# Patient Record
Sex: Male | Born: 1990 | Race: Black or African American | Hispanic: No | Marital: Single | Smoking: Former smoker
Health system: Southern US, Community
[De-identification: ages and names within clinical notes are randomized; demographics above are authoritative.]

---

## 2008-12-12 ENCOUNTER — Emergency Department (HOSPITAL_COMMUNITY): Admission: EM | Admit: 2008-12-12 | Discharge: 2008-12-12 | Payer: Self-pay | Admitting: Emergency Medicine

## 2009-07-21 ENCOUNTER — Emergency Department (HOSPITAL_COMMUNITY): Admission: EM | Admit: 2009-07-21 | Discharge: 2009-07-21 | Payer: Self-pay | Admitting: Emergency Medicine

## 2012-11-17 ENCOUNTER — Emergency Department (HOSPITAL_COMMUNITY): Payer: Medicaid Other

## 2012-11-17 ENCOUNTER — Emergency Department (HOSPITAL_COMMUNITY)
Admission: EM | Admit: 2012-11-17 | Discharge: 2012-11-17 | Disposition: A | Discharge status: Home / Self Care | Payer: Medicaid Other | Attending: Emergency Medicine | Admitting: Emergency Medicine

## 2012-11-17 ENCOUNTER — Encounter (HOSPITAL_COMMUNITY): Payer: Self-pay | Admitting: *Deleted

## 2012-11-17 DIAGNOSIS — M25559 Pain in unspecified hip: Secondary | ICD-10-CM | POA: Insufficient documentation

## 2012-11-17 DIAGNOSIS — M7071 Other bursitis of hip, right hip: Secondary | ICD-10-CM

## 2012-11-17 DIAGNOSIS — M76899 Other specified enthesopathies of unspecified lower limb, excluding foot: Secondary | ICD-10-CM | POA: Insufficient documentation

## 2012-11-17 MED ORDER — IBUPROFEN 800 MG PO TABS: 800.0000 mg | ORAL_TABLET | Freq: Three times a day (TID) | ORAL | Status: DC

## 2012-11-17 NOTE — ED Provider Notes (Signed)
History     CSN: [id number]  Arrival date & time 11/17/12  1055   First MD Initiated Contact with Patient 11/17/12 1112      Chief Complaint  Patient presents with  . Leg Pain    (Consider location/radiation/quality/duration/timing/severity/associated sxs/prior treatment) HPI  Pt is a 22 yo M presenting for right lateral hip pain with radiation to leg began 6 months ago after the patient was jogging. He denies tripping, falling, or any other mechanism of injury. He describes the pain as stiffness with some burning sensation. He denies any numbness or tingling to the extremity. He states stretching and light exercise help the pain. Pain is aggravated when he lays on his right side. He denies previous injury to the extremity. Denies fever, chills, vomiting, bladder or bowel incontinence.  History reviewed. No pertinent past medical history.  History reviewed. No pertinent past surgical history.  History reviewed. No pertinent family history.  History  Substance Use Topics  . Smoking status: Former Games developer  . Smokeless tobacco: Not on file  . Alcohol Use: No      Review of Systems  Constitutional: Negative for fever and chills.  HENT: Negative for neck pain.   Eyes: Negative for visual disturbance.  Respiratory: Negative for shortness of breath.   Cardiovascular: Negative for chest pain.  Gastrointestinal: Negative for abdominal pain.  Genitourinary: Negative for dysuria.  Musculoskeletal: Positive for myalgias. Negative for back pain and gait problem.  Neurological: Negative for weakness and numbness.    Allergies  Review of patient's allergies indicates no known allergies.  Home Medications   Current Outpatient Rx  Name  Route  Sig  Dispense  Refill  . ibuprofen (ADVIL,MOTRIN) 800 MG tablet   Oral   Take 1 tablet (800 mg total) by mouth 3 (three) times daily.   30 tablet   0     BP 114/69  Pulse 57  Temp(Src) 97.8 F (36.6 C) (Oral)  Resp 18  SpO2  100%  Physical Exam  Constitutional: He is oriented to person, place, and time. He appears well-developed and well-nourished.  HENT:  Head: Normocephalic and atraumatic.  Eyes: EOM are normal. Pupils are equal, round, and reactive to light.  Cardiovascular: Normal rate, regular rhythm and normal heart sounds.   Pulmonary/Chest: Effort normal and breath sounds normal.  Abdominal: Soft. Bowel sounds are normal.  Musculoskeletal:       Right hip: He exhibits tenderness. He exhibits normal range of motion, normal strength, no swelling and no deformity.       Left hip: Normal.       Legs: Neurological: He is alert and oriented to person, place, and time. Gait normal.  Skin: Skin is warm and dry.  Psychiatric: He has a normal mood and affect.    ED Course  Procedures (including critical care time)  Labs Reviewed - No data to display Dg Hip Complete Right  11/17/2012   *RADIOLOGY REPORT*  Clinical Data: Twisting injury 5 months ago with persistent pain.  RIGHT HIP - COMPLETE 2+ VIEW  Comparison: None  Findings: Femoral heads are located.  Sacroiliac joints are symmetric.  Joint spaces maintained.  No acute fracture.  IMPRESSION: No acute osseous abnormality.   Original Report Authenticated By: Jeronimo Greaves, M.D.     1. Bursitis, hip, right       MDM  Patient with right hip pain with radiation.  No neurological deficits and normal neuro exam. Lateral hip and thigh tender to palpation. Patient can  walk but states is painful.   No loss of bowel or bladder control.  No concern for cauda equina.  No fever, night sweats, weight loss, h/o cancer, IVDU. History and PE findings consistent with trochanteric bursitis. Advised to follow up with PCP and/or orthopedist if not improving. RICE protocol and pain medicine indicated and discussed with patient. Patient is stable at time of discharge          Jeannetta Ellis, PA-C 11/17/12 1610

## 2012-11-17 NOTE — Discharge Instructions (Signed)
Please call up with orthopedist if your hip pain is not improving. Please take all medications as prescribed. Please read all discharge instructions and return precautions.   Hip Pain The hips join the upper legs to the lower pelvis. The bones, cartilage, tendons, and muscles of the hip joint perform a lot of work each day holding your body weight and allowing you to move around. Hip pain is a common symptom. It can range from a minor ache to severe pain on 1 or both hips. Pain may be felt on the inside of the hip joint near the groin, or the outside near the buttocks and upper thigh. There may be swelling or stiffness as well. It occurs more often when a person walks or performs activity. There are many reasons hip pain can develop. CAUSES  It is important to work with your caregiver to identify the cause since many conditions can impact the bones, cartilage, muscles, and tendons of the hips. Causes for hip pain include:  Broken (fractured) bones.  Separation of the thighbone from the hip socket (dislocation).  Torn cartilage of the hip joint.  Swelling (inflammation) of a tendon (tendonitis), the sac within the hip joint (bursitis), or a joint.  A weakening in the abdominal wall (hernia), affecting the nerves to the hip.  Arthritis in the hip joint or lining of the hip joint.  Pinched nerves in the back, hip, or upper thigh.  A bulging disc in the spine (herniated disc).  Rarely, bone infection or cancer. DIAGNOSIS  The location of your hip pain will help your caregiver understand what may be causing the pain. A diagnosis is based on your medical history, your symptoms, results from your physical exam, and results from diagnostic tests. Diagnostic tests may include X-ray exams, a computerized magnetic scan (magnetic resonance imaging, MRI), or bone scan. TREATMENT  Treatment will depend on the cause of your hip pain. Treatment may include:  Limiting activities and resting until  symptoms improve.  Crutches or other walking supports (a cane or brace).  Ice, elevation, and compression.  Physical therapy or home exercises.  Shoe inserts or special shoes.  Losing weight.  Medications to reduce pain.  Undergoing surgery. HOME CARE INSTRUCTIONS   Only take over-the-counter or prescription medicines for pain, discomfort, or fever as directed by your caregiver.  Put ice on the injured area:  Put ice in a plastic bag.  Place a towel between your skin and the bag.  Leave the ice on for 15 to 20 minutes at a time, 3 to 4 times a day.  Keep your leg raised (elevated) when possible to lessen swelling.  Avoid activities that cause pain.  Follow specific exercises as directed by your caregiver.  Sleep with a pillow between your legs on your most comfortable side.  Record how often you have hip pain, the location of the pain, and what it feels like. This information may be helpful to you and your caregiver.  Ask your caregiver about returning to work or sports and whether you should drive.  Follow up with your caregiver for further exams, therapy, or testing as directed. SEEK MEDICAL CARE IF:   Your pain or swelling continues or worsens after 1 week.  You are feeling unwell or have chills.  You have increasing difficulty with walking.  You have a loss of sensation or other new symptoms.  You have questions or concerns. SEEK IMMEDIATE MEDICAL CARE IF:   You cannot put weight on the affected  hip.  You have fallen.  You have a sudden increase in pain and swelling in your hip.  You have a fever. MAKE SURE YOU:   Understand these instructions.  Will watch your condition.  Will get help right away if you are not doing well or get worse. Document Released: 12/07/2009 Document Revised: 09/11/2011 Document Reviewed: 12/07/2009 [facility] Patient Information 2013 [place], [place].    Hip Bursitis Bursitis is a swelling and soreness (inflammation)  of a fluid-filled sac (bursa). This sac overlies and protects the joints.  CAUSES   Injury.  Overuse of the muscles surrounding the joint.  Arthritis.  Gout.  Infection.  Cold weather.  Inadequate warm-up and conditioning prior to activities. The cause may not be known.  SYMPTOMS   Mild to severe irritation.  Tenderness and swelling over the outside of the hip.  Pain with motion of the hip.  If the bursa becomes infected, a fever may be present. Redness, tenderness, and warmth will develop over the hip. Symptoms usually lessen in 3 to 4 weeks with treatment, but can come back. TREATMENT If conservative treatment does not work, your caregiver may advise draining the bursa and injecting cortisone into the area. This may speed up the healing process. This may also be used as an initial treatment of choice. HOME CARE INSTRUCTIONS   Apply ice to the affected area for 15 to 20 minutes every 3 to 4 hours while awake for the first 2 days. Put the ice in a plastic bag and place a towel between the bag of ice and your skin.  Rest the painful joint as much as possible, but continue to put the joint through a normal range of motion at least 4 times per day. When the pain lessens, begin normal, slow movements and usual activities to help prevent stiffness of the hip.  Only take over-the-counter or prescription medicines for pain, discomfort, or fever as directed by your caregiver.  Use crutches to limit weight bearing on the hip joint, if advised.  Elevate your painful hip to reduce swelling. Use pillows for propping and cushioning your legs and hips.  Gentle massage may provide comfort and decrease swelling. SEEK IMMEDIATE MEDICAL CARE IF:   Your pain increases even during treatment, or you are not improving.  You have a fever.  You have heat and inflammation over the involved bursa.  You have any other questions or concerns. MAKE SURE YOU:   Understand these  instructions.  Will watch your condition.  Will get help right away if you are not doing well or get worse. Document Released: 12/09/2001 Document Revised: 09/11/2011 Document Reviewed: 07/08/2008 [facility] Patient Information 2013 [place], [place].

## 2012-11-17 NOTE — ED Notes (Signed)
Patient transported to X-ray 

## 2012-11-17 NOTE — ED Notes (Signed)
Pt reports right hip pain x 6 months, started after jogging. Ambulatory at triage.

## 2012-11-18 NOTE — ED Provider Notes (Signed)
  Medical screening examination/treatment/procedure(s) were performed by non-physician practitioner and as supervising physician I was immediately available for consultation/collaboration.    Gerhard Munch, MD 11/18/12 [phone]

## 2013-11-21 ENCOUNTER — Encounter (HOSPITAL_COMMUNITY): Payer: Self-pay | Admitting: Emergency Medicine

## 2013-11-21 ENCOUNTER — Emergency Department (HOSPITAL_COMMUNITY)
Admission: EM | Admit: 2013-11-21 | Discharge: 2013-11-21 | Discharge status: Against Medical Advice | Payer: Medicaid Other | Attending: Emergency Medicine | Admitting: Emergency Medicine

## 2013-11-21 DIAGNOSIS — M79609 Pain in unspecified limb: Secondary | ICD-10-CM | POA: Insufficient documentation

## 2013-11-21 NOTE — ED Notes (Signed)
Pt. reports chronic right leg pain for 2 years , ambulatory / denies injury.

## 2013-11-21 NOTE — ED Notes (Signed)
PA-C made aware of pts. Leaving without being seen post triage.

## 2013-11-21 NOTE — ED Notes (Signed)
Pt. States, "don't have time to wait...  Just left."

## 2014-01-07 ENCOUNTER — Encounter (HOSPITAL_COMMUNITY): Payer: Self-pay | Admitting: Emergency Medicine

## 2014-01-07 ENCOUNTER — Emergency Department (HOSPITAL_COMMUNITY)
Admission: EM | Admit: 2014-01-07 | Discharge: 2014-01-08 | Disposition: A | Discharge status: Home / Self Care | Payer: Medicaid Other | Attending: Emergency Medicine | Admitting: Emergency Medicine

## 2014-01-07 DIAGNOSIS — Z87891 Personal history of nicotine dependence: Secondary | ICD-10-CM | POA: Diagnosis not present

## 2014-01-07 DIAGNOSIS — L500 Allergic urticaria: Secondary | ICD-10-CM | POA: Insufficient documentation

## 2014-01-07 DIAGNOSIS — Z791 Long term (current) use of non-steroidal anti-inflammatories (NSAID): Secondary | ICD-10-CM | POA: Diagnosis not present

## 2014-01-07 DIAGNOSIS — R21 Rash and other nonspecific skin eruption: Secondary | ICD-10-CM | POA: Diagnosis present

## 2014-01-07 DIAGNOSIS — L509 Urticaria, unspecified: Secondary | ICD-10-CM

## 2014-01-07 NOTE — ED Notes (Signed)
Patient here with rash on legs, back, belly, and arms. Started today while in the heat while wearing long clothing. Patient believes it may be related to his deodorant spray.

## 2014-01-08 MED ORDER — DIPHENHYDRAMINE HCL 50 MG/ML IJ SOLN
25.0000 mg | Freq: Once | INTRAMUSCULAR | Status: AC
  Administered 2014-01-08: 25 mg via INTRAMUSCULAR
  Filled 2014-01-08: qty 1

## 2014-01-08 MED ORDER — DIPHENHYDRAMINE HCL 25 MG PO TABS: 25.0000 mg | ORAL_TABLET | Freq: Four times a day (QID) | ORAL | Status: DC

## 2014-01-08 MED ORDER — PREDNISONE 20 MG PO TABS
60.0000 mg | ORAL_TABLET | Freq: Once | ORAL | Status: AC
  Administered 2014-01-08: 60 mg via ORAL
  Filled 2014-01-08: qty 3

## 2014-01-08 NOTE — ED Provider Notes (Signed)
CSN: [id number]     Arrival date & time 01/07/14  2219 History   First MD Initiated Contact with Patient 01/08/14 0024     Chief Complaint  Patient presents with  . Rash   HPI  History provided by the patient and family. Patient is a 23 year old male with no significant PMH presenting with concern for rash. Patient reports developing a rash shortly after using old spice body deodorant spray around 5 PM. He tried to use some ointment that he has used for his eczema in the past without any improvement. Rash is over bilateral legs, abdomen chest and arms. Denies any swelling of the lips, tongue or mouth. No difficulty breathing or swallowing. No other aggravating or alleviating factors. No other associated symptoms.    History reviewed. No pertinent past medical history. History reviewed. No pertinent past surgical history. History reviewed. No pertinent family history. History  Substance Use Topics  . Smoking status: Former Games developermoker  . Smokeless tobacco: Not on file  . Alcohol Use: No    Review of Systems  Constitutional: Negative for fever.  Respiratory: Negative for shortness of breath.   All other systems reviewed and are negative.     Allergies  Review of patient's allergies indicates no known allergies.  Home Medications   Prior to Admission medications   Medication Sig Start Date End Date Taking? Authorizing Provider  ibuprofen (ADVIL,MOTRIN) 800 MG tablet Take 1 tablet (800 mg total) by mouth 3 (three) times daily. 11/17/12   Jennifer L Piepenbrink, PA-C   BP 126/64  Pulse 65  Temp(Src) 97.8 F (36.6 C) (Oral)  Resp 18  SpO2 99% Physical Exam  Nursing note and vitals reviewed. Constitutional: He is oriented to person, place, and time. He appears well-developed and well-nourished. No distress.  HENT:  Head: Normocephalic.  Cardiovascular: Normal rate and regular rhythm.   Pulmonary/Chest: Effort normal and breath sounds normal.  Abdominal: Soft.  Neurological: He  is alert and oriented to person, place, and time.  Skin: Skin is warm. Rash noted.  Urticarial rash over extremities abdomen chest and arms.  Psychiatric: He has a normal mood and affect. His behavior is normal.    ED Course  Procedures   COORDINATION OF CARE:  Nursing notes reviewed. Vital signs reviewed. Initial pt interview and examination performed.   Filed Vitals:   01/07/14 2243  BP: 126/64  Pulse: 65  Temp: 97.8 F (36.6 C)  TempSrc: Oral  Resp: 18  SpO2: 99%    12:33 AM-patient seen and evaluated. Patient appears well in no acute distress. No signs for severe anaphylactic reaction. Patient does report slight improvements. We'll give dose of steroid and Benadryl. Patient to continue Benadryl at home.       MDM   Final diagnoses:  Hives        Angus Sellereter S Elky Funches, PA-C 01/08/14 0100

## 2014-01-08 NOTE — Discharge Instructions (Signed)
Avoid body sprays. Take benadryl for rash and itch.   Hives Hives are itchy, red, swollen areas of the skin. They can vary in size and location on your body. Hives can come and go for hours or several days (acute hives) or for several weeks (chronic hives). Hives do not spread from person to person (noncontagious). They may get worse with scratching, exercise, and emotional stress. CAUSES   Allergic reaction to food, additives, or drugs.  Infections, including the common cold.  Illness, such as vasculitis, lupus, or thyroid disease.  Exposure to sunlight, heat, or cold.  Exercise.  Stress.  Contact with chemicals. SYMPTOMS   Red or white swollen patches on the skin. The patches may change size, shape, and location quickly and repeatedly.  Itching.  Swelling of the hands, feet, and face. This may occur if hives develop deeper in the skin. DIAGNOSIS  Your caregiver can usually tell what is wrong by performing a physical exam. Skin or blood tests may also be done to determine the cause of your hives. In some cases, the cause cannot be determined. TREATMENT  Mild cases usually get better with medicines such as antihistamines. Severe cases may require an emergency epinephrine injection. If the cause of your hives is known, treatment includes avoiding that trigger.  HOME CARE INSTRUCTIONS   Avoid causes that trigger your hives.  Take antihistamines as directed by your caregiver to reduce the severity of your hives. Non-sedating or low-sedating antihistamines are usually recommended. Do not drive while taking an antihistamine.  Take any other medicines prescribed for itching as directed by your caregiver.  Wear loose-fitting clothing.  Keep all follow-up appointments as directed by your caregiver. SEEK MEDICAL CARE IF:   You have persistent or severe itching that is not relieved with medicine.  You have painful or swollen joints. SEEK IMMEDIATE MEDICAL CARE IF:   You have a  fever.  Your tongue or lips are swollen.  You have trouble breathing or swallowing.  You feel tightness in the throat or chest.  You have abdominal pain. These problems may be the first sign of a life-threatening allergic reaction. Call your local emergency services (911 in [place] MAKE SURE YOU:   Understand these instructions.  Will watch your condition.  Will get help right away if you are not doing well or get worse. Document Released: 06/19/2005 Document Revised: 06/24/2013 Document Reviewed: 09/12/2011 [facility] Patient Information 2015 [employer] [place]. This information is not intended to replace advice given to you by your health care provider. Make sure you discuss any questions you have with your health care provider.

## 2014-01-08 NOTE — ED Provider Notes (Signed)
Medical screening examination/treatment/procedure(s) were performed by non-physician practitioner and as supervising physician I was immediately available for consultation/collaboration.   EKG Interpretation None        Niana Martorana S Kahne Helfand, MD 01/08/14 2015 

## 2014-06-01 ENCOUNTER — Emergency Department (HOSPITAL_COMMUNITY)
Admission: EM | Admit: 2014-06-01 | Discharge: 2014-06-01 | Discharge status: Against Medical Advice | Payer: Medicaid Other | Attending: Emergency Medicine | Admitting: Emergency Medicine

## 2014-06-01 ENCOUNTER — Encounter (HOSPITAL_COMMUNITY): Payer: Self-pay | Admitting: *Deleted

## 2014-06-01 DIAGNOSIS — R079 Chest pain, unspecified: Secondary | ICD-10-CM | POA: Diagnosis present
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
## 2014-06-02 ENCOUNTER — Emergency Department (HOSPITAL_COMMUNITY)
Admission: EM | Admit: 2014-06-02 | Discharge: 2014-06-02 | Disposition: A | Discharge status: Home / Self Care | Payer: Medicaid Other | Attending: Emergency Medicine | Admitting: Emergency Medicine

## 2014-06-02 ENCOUNTER — Encounter (HOSPITAL_COMMUNITY): Payer: Self-pay | Admitting: Emergency Medicine

## 2014-06-02 ENCOUNTER — Emergency Department (HOSPITAL_COMMUNITY): Payer: Medicaid Other

## 2014-06-02 DIAGNOSIS — R55 Syncope and collapse: Secondary | ICD-10-CM | POA: Diagnosis present

## 2014-06-02 DIAGNOSIS — Z87891 Personal history of nicotine dependence: Secondary | ICD-10-CM | POA: Diagnosis not present

## 2014-06-02 DIAGNOSIS — R569 Unspecified convulsions: Secondary | ICD-10-CM | POA: Diagnosis not present

## 2014-06-02 LAB — I-STAT CHEM 8, ED
BUN: 7 mg/dL (ref 6–23)
CALCIUM ION: 1.17 mmol/L (ref 1.12–1.23)
Chloride: 101 mEq/L (ref 96–112)
Creatinine, Ser: 1.1 mg/dL (ref 0.50–1.35)
GLUCOSE: 97 mg/dL (ref 70–99)
HCT: 41 % (ref 39.0–52.0)
Hemoglobin: 13.9 g/dL (ref 13.0–17.0)
Potassium: 3.7 mEq/L (ref 3.7–5.3)
Sodium: 142 mEq/L (ref 137–147)
TCO2: 25 mmol/L (ref 0–100)

## 2014-06-02 NOTE — ED Notes (Signed)
Pt reports he has sucidal thoughts, will notify MD Dr. Hyacinth MeekerMiller

## 2014-06-02 NOTE — ED Notes (Signed)
I gave the patient a happy meal and 2 containers of apple juice per Nurse Joni ReiningNicole.

## 2014-06-02 NOTE — Discharge Instructions (Signed)
°Emergency Department Resource Guide °1) Find a Doctor and Pay Out of Pocket °Although you won't have to find out who is covered by your insurance plan, it is a good idea to ask around and get recommendations. You will then need to call the office and see if the doctor you have chosen will accept you as a new patient and what types of options they offer for patients who are self-pay. Some doctors offer discounts or will set up payment plans for their patients who do not have insurance, but you will need to ask so you aren't surprised when you get to your appointment. ° °2) Contact Your Local Health Department °Not all health departments have doctors that can see patients for sick visits, but many do, so it is worth a call to see if yours does. If you don't know where your local health department is, you can check in your phone book. The CDC also has a tool to help you locate your state's health department, and many state websites also have listings of all of their local health departments. ° °3) Find a Walk-in Clinic °If your illness is not likely to be very severe or complicated, you may want to try a walk in clinic. These are popping up all over the country in pharmacies, drugstores, and shopping centers. They're usually staffed by nurse practitioners or physician assistants that have been trained to treat common illnesses and complaints. They're usually fairly quick and inexpensive. However, if you have serious medical issues or chronic medical problems, these are probably not your best option. ° °No Primary Care Doctor: °- Call [facility] at  832-8000 - they can help you locate a primary care doctor that  accepts your insurance, provides certain services, etc. °- Physician Referral Service- 1-800-533-3463 ° °Chronic Pain Problems: °Organization         Address  Phone   Notes  °[facility]  (336) 297-2271 Patients need to be referred by their primary care doctor.  ° °Medication  Assistance: °Organization         Address  Phone   Notes  °[place] [facility] [address] °[place], [place] [address] (336) 641-8030 --Must be a resident of [place] °-- Must have NO insurance coverage whatsoever (no Medicaid/ Medicare, etc.) °-- The pt. MUST have a primary care doctor that directs their care regularly and follows them in the community °  °[facility]  (866) 331-1348   °[employer]  (888) 892-1162   ° °Agencies that provide inexpensive medical care: °Organization         Address  Phone   Notes  °[facility]  (336) 832-8035   °[facility]    (336) 832-7272   °[facility] [address] °[place], [place] [address] (336) 832-4777   °[facility] [address], °[place] (336) 271-4999   °[facility]    (336) 373-0678   °[facility]    (336) 272-1050   °[facility] ° [address], [place] Phone:  (336) 832-4444, Fax:  (336) 832-4440 Hours of Operation:  9 am - 6 pm, M-F.  Also accepts Medicaid/Medicare and self-pay.  °[facility] ° [address], [place] Phone: (336) 832-3150, Fax: (336) 832-3151. Hours of Operation:  8:30 am - 5:30 pm, M-F.  Also accepts Medicaid and self-pay.  °[facility] [place] [address], [place] Phone: (336) 878-6027   °[facility] [address], [place], [place] (336)723-1848, Ext. 123 Mondays & Thursdays: 7-9 AM.  First 15 patients are seen on a first come, first serve basis. °  ° °Medicaid-accepting [place] Providers: ° °Organization         Address  Phone   Notes  °[facility] [address], [place] (336) 641-2100 Also accepts self-pay patients.  °[facility] [address], [place] ° (336) 856-9996   °[facility] [address], [place]  (336) 288-8857   °[facility] [address] [place] [address], [place] (336) 299-7000   °Veita Bland [address], [place]  ° (336) 373-1557 Only accepts [place] Access Medicaid patients after they have their name applied to their card.  ° °Self-Pay (no insurance) in [place]: ° °Organization         Address  Phone   Notes  °Sickle Cell Patients, [facility] [address], [place] (336) 832-1970   °[facility] [address], [place] (336) 832-4400   °[facility] ° [address], [place] (336) 992-4800   °[facility]/Dr. Osei-Bonsu ° [address] [place] [address], [place] or [address], [place] (336) 841-8500 Phone number for both [place] and [place] locations is the same.  °[facility] [address], [place] (336) 299-0000   °[facility] [address] [place] [address], [place] or [address] (336) 852-7530 °(336) 878-2260   °[facility] [address], [place] (336) 350-1642, phone; (336) 294-5005, fax Sees patients 1st and 3rd Saturday of every month.  Must not qualify for public or private insurance (i.e. Medicaid, Medicare, [place] Health Choice, Veterans' Benefits) • Household income should be no more than 200% of the poverty level •The clinic cannot treat you if you are pregnant or think you are pregnant • Sexually transmitted diseases are not treated at the clinic.  ° ° °Dental Care: °Organization         Address  Phone  Notes  °[place] [facility] [address], [place] (336) 641-6152 Accepts children up to age 21 who are enrolled in Medicaid or [place] Health Choice; pregnant women with a Medicaid card; and children who have applied for Medicaid or [place] Health Choice, but were declined, whose parents can pay a reduced fee at time of service.  °[place]  [facility] [place]  [address], [place] (336) 641-7733 Accepts children up to age 21 who are enrolled in Medicaid or [place] Health Choice; pregnant women with a Medicaid card; and children who have applied for Medicaid or [place] Health Choice, but were declined, whose parents can pay a reduced fee at time of service.  °[facility] ° [address], [place] (336) 641-4533 Patients are seen by appointment only. Walk-ins are not accepted. [facility] will see patients 18 years of age and older. °Monday - Tuesday (8am-5pm) °Most Wednesdays (8:30-5pm) °$30 per visit, cash only  °[facility] ° [address], [place] (336) 641-4533 Patients are seen by appointment only. Walk-ins are not accepted. [facility] will see patients 18 years of age and older. °One   Wednesday Evening (Monthly: Volunteer Based).  $30 per visit, cash only  °[facility]  (919) 537-3737 for adults; Children under age 4, call Graduate Pediatric Dentistry at (919) [phone]. Children aged 4-14, please call (919) 537-3737 to request a pediatric application. ° Dental services are provided in all areas of dental care including fillings, crowns and bridges, complete and partial dentures, implants, gum treatment, root canals, and extractions. Preventive care is also provided. Treatment is provided to both adults and children. °Patients are selected via a lottery and there is often a waiting list. °  °[facility] [address], °[place] ° (336) 763-8833 [email or web address] °  °[facility] [address], [place], [place] (336)723-1848, Ext. 123 Second and Fourth Thursday of each month, opens at 6:30 AM; Clinic ends at 9 AM.  Patients are seen on a first-come first-served basis, and a limited number are seen during each clinic.  ° °[facility] ° [address], [place], [place] (336) 723-7904    Eligibility Requirements °You must have lived in [place], [place], or [place] counties for at least the last three months. °  You cannot be eligible for state or federal sponsored healthcare insurance, including Veterans Administration, Medicaid, or Medicare. °  You generally cannot be eligible for healthcare insurance through your employer.  °  How to apply: °Eligibility screenings are held every Tuesday and Wednesday afternoon from 1:00 pm until 4:00 pm. You do not need an appointment for the interview!  °[facility] [address], [place], [place] 336-631-2330   °[facility]  336-342-8273   °[facility]  336-703-3100   °[facility]  336-570-6415   ° °Behavioral Health Resources in the Community: °Intensive Outpatient Programs °Organization         Address  Phone  Notes  °[facility] [address], [place], [place] 336-878-6098   °[facility] [address], [place], [place] 336-832-9800   °[facility] [address], [place], [place] ° 336-882-2125   °[facility] [address],  °[place], [place] 1-800-853-5163 or 336-641-4981   °Substance Abuse Resources °Organization         Address  Phone  Notes  °[facility]  336-882-2125   °[facility]  336-784-9470   °[facility]  336-285-9073   °[facility]  336-845-3988   °Residential & Outpatient Substance Abuse Program  1-800-659-3381   °Psychological Services °Organization         Address  Phone  Notes  °[facility]  336- 832-9600   °[facility]  336- 378-7881   °[facility] [address], [place] 1-800-853-5163 or 336-641-4981   ° °Mobile Crisis Teams °Organization         Address  Phone  Notes  °[facility], Mobile Crisis Care Unit  1-877-626-1772   °Assertive °[facility] ° [address]  [place], [place] 336-834-9664   °Sharon DeEsch [address] °[place] 336-554-5454   ° °Self-Help/Support Groups °Organization         Address  Phone             Notes  °[facility]  - variety of support groups  336- 373-1402 Call for more information  °Narcotics Anonymous (NA), [facility] [address], °[place]   2 meetings at this location  ° °  Residential Treatment Programs °Organization         Address  Phone  Notes  °[facility] [address],    °[place]  1-866-801-8205   °[facility] ° [address], [place], [place] 704-293-8524   °[facility] [address], [place] 336-845-3988 Admissions: 8am-3pm M-F  °[facility] [address]    °[place], [place] 336-841-1104   °[facility] [address], [place], [place] 336-379-7146   °[facility] [address]  °[place], [place] 336-285-9073   °[facility] - Intensive Outpatient [address], [place], [place] 336-852-3033   °[facility] [address]  °[place], [place] 1-877-615-2722 or 336-784-9470   °[facility] [address] [place], [place] 336-227-7417 Accepts Medicaid  °[facility] [address]  °[place] 1-800-659-3381 Substance Abuse/Addiction Treatment  ° °[place] Behavioral Health Resources °Organization         Address  Phone  Notes  °[facility]  (888) 581-9988   °Julie Brannon, PhD [address] [place], [place]   (336) 349-5553 or (336) 951-0000   °[facility]   [address] °[place], [place] (336) 349-4454   °[facility] [address], [place], [place] (336) 342-8316 Insurance/Medicaid/sponsorship through [facility]  °[facility] [address]                                    [place], [place] (336) 342-8316 Therapy/tele-psych/case    °[facility] [address]  ° [place],  (336) 349-2233    °Dr. Arfeen  (336) 349-4544   °[facility] [place]  [employer] [place] [facility] 1) [address], [place] °2) [address], [place] °3)  [address], [place] (336) 349-3220 °(336) 342-7768 ° °(336) 342-8140   °[place] [facility] (336) 342-1394 or (336) 342-3537 (After Hours)    ° ° °
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
## 2014-06-02 NOTE — ED Notes (Signed)
Chem 8 results given to Dr. Miller 
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
## 2014-06-02 NOTE — ED Notes (Signed)
I gave the patient a container of apple juice per Dr. Hyacinth MeekerMiller.

## 2014-07-01 ENCOUNTER — Emergency Department (HOSPITAL_COMMUNITY)
Admission: EM | Admit: 2014-07-01 | Discharge: 2014-07-01 | Disposition: A | Discharge status: Home / Self Care | Payer: Medicaid Other | Attending: Emergency Medicine | Admitting: Emergency Medicine

## 2014-07-01 ENCOUNTER — Encounter (HOSPITAL_COMMUNITY): Payer: Self-pay | Admitting: *Deleted

## 2014-07-01 DIAGNOSIS — Z87891 Personal history of nicotine dependence: Secondary | ICD-10-CM | POA: Insufficient documentation

## 2014-07-01 DIAGNOSIS — Z202 Contact with and (suspected) exposure to infections with a predominantly sexual mode of transmission: Secondary | ICD-10-CM | POA: Insufficient documentation

## 2014-07-01 LAB — RPR

## 2014-07-01 LAB — HIV ANTIBODY (ROUTINE TESTING W REFLEX): HIV 1&2 Ab, 4th Generation: NONREACTIVE

## 2014-07-01 MED ORDER — AZITHROMYCIN 250 MG PO TABS
1000.0000 mg | ORAL_TABLET | Freq: Once | ORAL | Status: AC
Start: 2014-07-01 — End: 2014-07-01
  Administered 2014-07-01: 1000 mg via ORAL
  Filled 2014-07-01: qty 4

## 2014-07-01 MED ORDER — CEFTRIAXONE SODIUM 250 MG IJ SOLR
250.0000 mg | Freq: Once | INTRAMUSCULAR | Status: AC
  Administered 2014-07-01: 250 mg via INTRAMUSCULAR
  Filled 2014-07-01: qty 250

## 2014-07-01 MED ORDER — LIDOCAINE HCL (PF) 1 % IJ SOLN
2.0000 mL | Freq: Once | INTRAMUSCULAR | Status: AC
  Administered 2014-07-01: 2 mL
  Filled 2014-07-01: qty 5

## 2014-07-01 NOTE — ED Notes (Signed)
Pt reports being exposed to gonorrhea and wants to be checked, denies any symptoms.

## 2014-07-01 NOTE — ED Notes (Signed)
Patient states his girlfriend was dx with gonorhea today.  Patient is here to be checked.  Patient admits to having sex with girlfriend.  Patient denies having any discharge, denies any pain when voiding.  Patient states he has not had any other partner for 4 years.

## 2014-07-01 NOTE — ED Provider Notes (Signed)
CSN: [id number]     Arrival date & time 07/01/14  1417 History  This chart was scribed for Fayrene HelperBowie Derico Mitton, PA-C, working with Toy CookeyMegan Docherty, MD by Chestine SporeSoijett Blue, ED Scribe. The patient was seen in room TR09C/TR09C at 3:08 PM.    Chief Complaint  Patient presents with  . Exposure to STD     The history is provided by the patient. No language interpreter was used.    HPI Comments: Allen FootsDerek Stafford is a 23 y.o. male who presents to the Emergency Department complaining of exposure to STD.  He notes that his girlfriend was dx with gonorrhea today and that she had sex with another partner. [facility]. He reports that he has been sexually active with the same partner for 4 years. He last had sex with his girlfriend last night. He denies eye pain, fever, testicular pain, penile discharge, penile pain, dysuria, and any other symptoms . Denies having STD before. Denies being allergic to any medications.    History reviewed. No pertinent past medical history. History reviewed. No pertinent past surgical history. History reviewed. No pertinent family history. History  Substance Use Topics  . Smoking status: Former Games developermoker  . Smokeless tobacco: Not on file  . Alcohol Use: No    Review of Systems  Constitutional: Negative for fever.  Eyes: Negative for pain.  Genitourinary: Negative for dysuria, discharge, penile pain and testicular pain.      Allergies  Review of patient's allergies indicates no known allergies.  Home Medications   Prior to Admission medications   Medication Sig Start Date End Date Taking? Authorizing Provider  diphenhydrAMINE (BENADRYL) 25 MG tablet Take 1 tablet (25 mg total) by mouth every 6 (six) hours. Patient not taking: Reported on 06/02/2014 01/08/14   Phill MutterPeter S Dammen, PA-C  ibuprofen (ADVIL,MOTRIN) 800 MG tablet Take 1 tablet (800 mg total) by mouth 3 (three) times daily. Patient not taking: Reported on 06/02/2014 11/17/12   Victorino DikeJennifer L Piepenbrink, PA-C    BP 119/81 mmHg  Pulse 65  Temp(Src) 98.1 F (36.7 C) (Oral)  Resp 18  SpO2 100%  Physical Exam  Constitutional: He is oriented to person, place, and time. He appears well-developed and well-nourished. No distress.  HENT:  Head: Normocephalic and atraumatic.  Eyes: EOM are normal.  Neck: Neck supple. No tracheal deviation present.  Cardiovascular: Normal rate.   Pulmonary/Chest: Effort normal. No respiratory distress.  Abdominal: Hernia confirmed negative in the right inguinal area and confirmed negative in the left inguinal area.  Genitourinary: Testes normal. Circumcised. No discharge found.  No rash noted.   Musculoskeletal: Normal range of motion.  Neurological: He is alert and oriented to person, place, and time.  Skin: Skin is warm and dry.  Psychiatric: He has a normal mood and affect. His behavior is normal.  Nursing note and vitals reviewed.   ED Course  Procedures (including critical care time) DIAGNOSTIC STUDIES: Oxygen Saturation is 10% on room air, normal by my interpretation.    COORDINATION OF CARE: 3:12 PM-Discussed treatment plan which includes RPR, HIV antibody, GC/Chlamydia Probe with pt at bedside and pt agreed to plan.   Rocephin and zithromax prophylactic treatment given. Safe sex practice discussed.  Labs Review Labs Reviewed  GC/CHLAMYDIA PROBE AMP  RPR  HIV ANTIBODY (ROUTINE TESTING)    Imaging Review No results found.   EKG Interpretation None      MDM   Final diagnoses:  STD exposure    BP 119/81 mmHg  Pulse 65  Temp(Src) 98.1 F (36.7 C) (Oral)  Resp 18  Wt 150 lb (68.04 kg)  SpO2 100%   I personally performed the services described in this documentation, which was scribed in my presence. The recorded information has been reviewed and is accurate.    Fayrene HelperBowie Niccolo Burggraf, PA-C 07/01/14 1606  Toy CookeyMegan Docherty, MD 07/02/14 [phone]

## 2014-07-01 NOTE — Discharge Instructions (Signed)
You have been given treatment for possible STD infection.  If results are positive for infection, you will be notified.  Follow instruction below.  Sexually Transmitted Disease A sexually transmitted disease (STD) is a disease or infection that may be passed (transmitted) from person to person, usually during sexual activity. This may happen by way of saliva, semen, blood, vaginal mucus, or urine. Common STDs include:   Gonorrhea.   Chlamydia.   Syphilis.   HIV and AIDS.   Genital herpes.   Hepatitis B and C.   Trichomonas.   Human papillomavirus (HPV).   Pubic lice.   Scabies.  Mites.  Bacterial vaginosis. WHAT ARE CAUSES OF STDs? An STD may be caused by bacteria, a virus, or parasites. STDs are often transmitted during sexual activity if one person is infected. However, they may also be transmitted through nonsexual means. STDs may be transmitted after:   Sexual intercourse with an infected person.   Sharing sex toys with an infected person.   Sharing needles with an infected person or using unclean piercing or tattoo needles.  Having intimate contact with the genitals, mouth, or rectal areas of an infected person.   Exposure to infected fluids during birth. WHAT ARE THE SIGNS AND SYMPTOMS OF STDs? Different STDs have different symptoms. Some people may not have any symptoms. If symptoms are present, they may include:   Painful or bloody urination.   Pain in the pelvis, abdomen, vagina, anus, throat, or eyes.   A skin rash, itching, or irritation.  Growths, ulcerations, blisters, or sores in the genital and anal areas.  Abnormal vaginal discharge with or without bad odor.   Penile discharge in men.   Fever.   Pain or bleeding during sexual intercourse.   Swollen glands in the groin area.   Yellow skin and eyes (jaundice). This is seen with hepatitis.   Swollen testicles.  Infertility.  Sores and blisters in the mouth. HOW ARE  STDs DIAGNOSED? To make a diagnosis, your health care provider may:   Take a medical history.   Perform a physical exam.   Take a sample of any discharge to examine.  Swab the throat, cervix, opening to the penis, rectum, or vagina for testing.  Test a sample of your first morning urine.   Perform blood tests.   Perform a Pap test, if this applies.   Perform a colposcopy.   Perform a laparoscopy.  HOW ARE STDs TREATED? Treatment depends on the STD. Some STDs may be treated but not cured.   Chlamydia, gonorrhea, trichomonas, and syphilis can be cured with antibiotic medicine.   Genital herpes, hepatitis, and HIV can be treated, but not cured, with prescribed medicines. The medicines lessen symptoms.   Genital warts from HPV can be treated with medicine or by freezing, burning (electrocautery), or surgery. Warts may come back.   HPV cannot be cured with medicine or surgery. However, abnormal areas may be removed from the cervix, vagina, or vulva.   If your diagnosis is confirmed, your recent sexual partners need treatment. This is true even if they are symptom-free or have a negative culture or evaluation. They should not have sex until their health care providers say it is okay. HOW CAN I REDUCE MY RISK OF GETTING AN STD? Take these steps to reduce your risk of getting an STD:  Use latex condoms, dental dams, and water-soluble lubricants during sexual activity. Do not use petroleum jelly or oils.  Avoid having multiple sex partners.  Do  not have sex with someone who has other sex partners.  Do not have sex with anyone you do not know or who is at high risk for an STD.  Avoid risky sex practices that can break your skin.  Do not have sex if you have open sores on your mouth or skin.  Avoid drinking too much alcohol or taking illegal drugs. Alcohol and drugs can affect your judgment and put you in a vulnerable position.  Avoid engaging in oral and anal sex  acts.  Get vaccinated for HPV and hepatitis. If you have not received these vaccines in the past, talk to your health care provider about whether one or both might be right for you.   If you are at risk of being infected with HIV, it is recommended that you take a prescription medicine daily to prevent HIV infection. This is called pre-exposure prophylaxis (PrEP). You are considered at risk if:  You are a man who has sex with other men (MSM).  You are a heterosexual man or woman and are sexually active with more than one partner.  You take drugs by injection.  You are sexually active with a partner who has HIV.  Talk with your health care provider about whether you are at high risk of being infected with HIV. If you choose to begin PrEP, you should first be tested for HIV. You should then be tested every 3 months for as long as you are taking PrEP.  WHAT SHOULD I DO IF I THINK I HAVE AN STD?  See your health care provider.   Tell your sexual partner(s). They should be tested and treated for any STDs.  Do not have sex until your health care provider says it is okay. WHEN SHOULD I GET IMMEDIATE MEDICAL CARE? Contact your health care provider right away if:   You have severe abdominal pain.  You are a man and notice swelling or pain in your testicles.  You are a woman and notice swelling or pain in your vagina. Document Released: 09/09/2002 Document Revised: 06/24/2013 Document Reviewed: 01/07/2013 [facility] Patient Information 2015 [place], [place]. This information is not intended to replace advice given to you by your health care provider. Make sure you discuss any questions you have with your health care provider.

## 2014-07-02 LAB — GC/CHLAMYDIA PROBE AMP
CT PROBE, AMP APTIMA: POSITIVE — AB
GC PROBE AMP APTIMA: NEGATIVE

## 2014-07-03 ENCOUNTER — Telehealth (HOSPITAL_COMMUNITY): Payer: Self-pay | Admitting: *Deleted

## 2014-07-16 ENCOUNTER — Encounter (HOSPITAL_COMMUNITY): Payer: Self-pay

## 2014-07-16 ENCOUNTER — Emergency Department (HOSPITAL_COMMUNITY)
Admission: EM | Admit: 2014-07-16 | Discharge: 2014-07-16 | Disposition: A | Discharge status: Home / Self Care | Payer: Medicaid Other | Attending: Emergency Medicine | Admitting: Emergency Medicine

## 2014-07-16 DIAGNOSIS — Z87891 Personal history of nicotine dependence: Secondary | ICD-10-CM | POA: Diagnosis not present

## 2014-07-16 DIAGNOSIS — Z113 Encounter for screening for infections with a predominantly sexual mode of transmission: Secondary | ICD-10-CM | POA: Diagnosis present

## 2014-07-16 NOTE — ED Provider Notes (Signed)
CSN: [id number]     Arrival date & time 07/16/14  1216 History  This chart was scribed for non-physician practitioner, Fayrene Helper, PA-C, working with Juliet Rude. Rubin Payor, MD by Charline Bills, ED Scribe. This patient was seen in room TR04C/TR04C and the patient's care was started at 1:45 PM.   Chief Complaint  Patient presents with  . Exposure to STD   The history is provided by the patient. No language interpreter was used.   HPI Comments: Allen Stafford is a 24 y.o. male who presents to the Emergency Department complaining of STD exposure. Pt was seen 2 weeks ago and tested positive for Chlamydia. Pt states that his partner was also treated. He denies fever, abdominal pain, penile discharge, rash and any other symptoms at this time. Pt states that he wants to be reevaluated to make sure Chlamydia has resolved.   History reviewed. No pertinent past medical history. History reviewed. No pertinent past surgical history. History reviewed. No pertinent family history. History  Substance Use Topics  . Smoking status: Former Games developer  . Smokeless tobacco: Not on file  . Alcohol Use: No    Review of Systems  Constitutional: Negative for fever.  Gastrointestinal: Negative for abdominal pain.  Genitourinary: Negative for discharge.  Skin: Negative for rash.    Allergies  Review of patient's allergies indicates no known allergies.  Home Medications   Prior to Admission medications   Medication Sig Start Date End Date Taking? Authorizing Provider  diphenhydrAMINE (BENADRYL) 25 MG tablet Take 1 tablet (25 mg total) by mouth every 6 (six) hours. Patient not taking: Reported on 06/02/2014 01/08/14   Phill Mutter Dammen, PA-C  ibuprofen (ADVIL,MOTRIN) 800 MG tablet Take 1 tablet (800 mg total) by mouth 3 (three) times daily. Patient not taking: Reported on 06/02/2014 11/17/12   Lise Auer Piepenbrink, PA-C   Triage Vitals: BP 123/74 mmHg  Pulse 56  Temp(Src) 97.8 F (36.6 C) (Oral)  Resp 14  Ht 5'  9" (1.753 m)  Wt 160 lb 6.4 oz (72.757 kg)  BMI 23.68 kg/m2  SpO2 100% Physical Exam  Constitutional: He is oriented to person, place, and time. He appears well-developed and well-nourished. No distress.  HENT:  Head: Normocephalic and atraumatic.  Eyes: Conjunctivae and EOM are normal.  Neck: Neck supple.  Cardiovascular: Normal rate.   Pulmonary/Chest: Effort normal.  Genitourinary: Circumcised. No discharge found.  Normal penile shaft. No testicular pain. Normal testicle lie. No rash or lesion. Normal cremasteric reflexes.  Musculoskeletal: Normal range of motion.  Neurological: He is alert and oriented to person, place, and time.  Skin: Skin is warm and dry.  Psychiatric: He has a normal mood and affect. His behavior is normal.  Nursing note and vitals reviewed.  ED Course  Procedures (including critical care time) DIAGNOSTIC STUDIES: Oxygen Saturation is 100% on RA, normal by my interpretation.    COORDINATION OF CARE: 1:48 PM-Discussed treatment plan which includes STD screening with pt at bedside and pt agreed to plan.   Labs Review Labs Reviewed - No data to display  Imaging Review No results found.   EKG Interpretation None      MDM   Final diagnoses:  Screen for STD (sexually transmitted disease)    BP 123/74 mmHg  Pulse 56  Temp(Src) 97.8 F (36.6 C) (Oral)  Resp 14  Ht  (1.753 m)  Wt 160 lb 6.4 oz (72.757 kg)  BMI 23.68 kg/m2  SpO2 100%   I personally performed the services  described in this documentation, which was scribed in my presence. The recorded information has been reviewed and is accurate.    Fayrene HelperBowie Tiamarie Furnari, PA-C 07/16/14 1352  Juliet RudeNathan R. Rubin PayorPickering, MD 07/17/14 [phone]

## 2014-07-16 NOTE — Discharge Instructions (Signed)
We have sent testing for gonorrhea and chlamydia.  If positive, we will contact you to let you know.   Sexually Transmitted Disease A sexually transmitted disease (STD) is a disease or infection that may be passed (transmitted) from person to person, usually during sexual activity. This may happen by way of saliva, semen, blood, vaginal mucus, or urine. Common STDs include:   Gonorrhea.   Chlamydia.   Syphilis.   HIV and AIDS.   Genital herpes.   Hepatitis B and C.   Trichomonas.   Human papillomavirus (HPV).   Pubic lice.   Scabies.  Mites.  Bacterial vaginosis. WHAT ARE CAUSES OF STDs? An STD may be caused by bacteria, a virus, or parasites. STDs are often transmitted during sexual activity if one person is infected. However, they may also be transmitted through nonsexual means. STDs may be transmitted after:   Sexual intercourse with an infected person.   Sharing sex toys with an infected person.   Sharing needles with an infected person or using unclean piercing or tattoo needles.  Having intimate contact with the genitals, mouth, or rectal areas of an infected person.   Exposure to infected fluids during birth. WHAT ARE THE SIGNS AND SYMPTOMS OF STDs? Different STDs have different symptoms. Some people may not have any symptoms. If symptoms are present, they may include:   Painful or bloody urination.   Pain in the pelvis, abdomen, vagina, anus, throat, or eyes.   A skin rash, itching, or irritation.  Growths, ulcerations, blisters, or sores in the genital and anal areas.  Abnormal vaginal discharge with or without bad odor.   Penile discharge in men.   Fever.   Pain or bleeding during sexual intercourse.   Swollen glands in the groin area.   Yellow skin and eyes (jaundice). This is seen with hepatitis.   Swollen testicles.  Infertility.  Sores and blisters in the mouth. HOW ARE STDs DIAGNOSED? To make a diagnosis, your  health care provider may:   Take a medical history.   Perform a physical exam.   Take a sample of any discharge to examine.  Swab the throat, cervix, opening to the penis, rectum, or vagina for testing.  Test a sample of your first morning urine.   Perform blood tests.   Perform a Pap test, if this applies.   Perform a colposcopy.   Perform a laparoscopy.  HOW ARE STDs TREATED? Treatment depends on the STD. Some STDs may be treated but not cured.   Chlamydia, gonorrhea, trichomonas, and syphilis can be cured with antibiotic medicine.   Genital herpes, hepatitis, and HIV can be treated, but not cured, with prescribed medicines. The medicines lessen symptoms.   Genital warts from HPV can be treated with medicine or by freezing, burning (electrocautery), or surgery. Warts may come back.   HPV cannot be cured with medicine or surgery. However, abnormal areas may be removed from the cervix, vagina, or vulva.   If your diagnosis is confirmed, your recent sexual partners need treatment. This is true even if they are symptom-free or have a negative culture or evaluation. They should not have sex until their health care providers say it is okay. HOW CAN I REDUCE MY RISK OF GETTING AN STD? Take these steps to reduce your risk of getting an STD:  Use latex condoms, dental dams, and water-soluble lubricants during sexual activity. Do not use petroleum jelly or oils.  Avoid having multiple sex partners.  Do not have sex with  someone who has other sex partners.  Do not have sex with anyone you do not know or who is at high risk for an STD.  Avoid risky sex practices that can break your skin.  Do not have sex if you have open sores on your mouth or skin.  Avoid drinking too much alcohol or taking illegal drugs. Alcohol and drugs can affect your judgment and put you in a vulnerable position.  Avoid engaging in oral and anal sex acts.  Get vaccinated for HPV and  hepatitis. If you have not received these vaccines in the past, talk to your health care provider about whether one or both might be right for you.   If you are at risk of being infected with HIV, it is recommended that you take a prescription medicine daily to prevent HIV infection. This is called pre-exposure prophylaxis (PrEP). You are considered at risk if:  You are a man who has sex with other men (MSM).  You are a heterosexual man or woman and are sexually active with more than one partner.  You take drugs by injection.  You are sexually active with a partner who has HIV.  Talk with your health care provider about whether you are at high risk of being infected with HIV. If you choose to begin PrEP, you should first be tested for HIV. You should then be tested every 3 months for as long as you are taking PrEP.  WHAT SHOULD I DO IF I THINK I HAVE AN STD?  See your health care provider.   Tell your sexual partner(s). They should be tested and treated for any STDs.  Do not have sex until your health care provider says it is okay. WHEN SHOULD I GET IMMEDIATE MEDICAL CARE? Contact your health care provider right away if:   You have severe abdominal pain.  You are a man and notice swelling or pain in your testicles.  You are a woman and notice swelling or pain in your vagina. Document Released: 09/09/2002 Document Revised: 06/24/2013 Document Reviewed: 01/07/2013 [facility] Patient Information 2015 [place], [place]. This information is not intended to replace advice given to you by your health care provider. Make sure you discuss any questions you have with your health care provider.

## 2014-07-16 NOTE — ED Notes (Signed)
Pt wanting to be reevaluated for gonorrhea.  Was seen here last month and told he had it and was treated.  Wants to make sure "it is out my system".  Denies discharge and pain.

## 2014-07-17 LAB — GC/CHLAMYDIA PROBE AMP (~~LOC~~) NOT AT ARMC
CHLAMYDIA, DNA PROBE: NEGATIVE
NEISSERIA GONORRHEA: NEGATIVE

## 2015-12-07 ENCOUNTER — Emergency Department (HOSPITAL_COMMUNITY)
Admission: EM | Admit: 2015-12-07 | Discharge: 2015-12-07 | Disposition: A | Discharge status: Home / Self Care | Payer: Medicaid Other | Attending: Emergency Medicine | Admitting: Emergency Medicine

## 2015-12-07 ENCOUNTER — Encounter (HOSPITAL_COMMUNITY): Payer: Self-pay

## 2015-12-07 DIAGNOSIS — T7840XA Allergy, unspecified, initial encounter: Secondary | ICD-10-CM

## 2015-12-07 DIAGNOSIS — Y998 Other external cause status: Secondary | ICD-10-CM | POA: Diagnosis not present

## 2015-12-07 DIAGNOSIS — T781XXA Other adverse food reactions, not elsewhere classified, initial encounter: Secondary | ICD-10-CM | POA: Insufficient documentation

## 2015-12-07 DIAGNOSIS — Y9289 Other specified places as the place of occurrence of the external cause: Secondary | ICD-10-CM | POA: Insufficient documentation

## 2015-12-07 DIAGNOSIS — R21 Rash and other nonspecific skin eruption: Secondary | ICD-10-CM | POA: Insufficient documentation

## 2015-12-07 DIAGNOSIS — Y9389 Activity, other specified: Secondary | ICD-10-CM | POA: Diagnosis not present

## 2015-12-07 DIAGNOSIS — X58XXXA Exposure to other specified factors, initial encounter: Secondary | ICD-10-CM | POA: Diagnosis not present

## 2015-12-07 DIAGNOSIS — Z87891 Personal history of nicotine dependence: Secondary | ICD-10-CM | POA: Insufficient documentation

## 2015-12-07 MED ORDER — PREDNISONE 20 MG PO TABS
60.0000 mg | ORAL_TABLET | Freq: Once | ORAL | Status: AC
  Administered 2015-12-07: 60 mg via ORAL
  Filled 2015-12-07: qty 3

## 2015-12-07 MED ORDER — EPINEPHRINE 0.3 MG/0.3ML IJ SOAJ: 0.3000 mg | Freq: Once | INTRAMUSCULAR | Status: DC

## 2015-12-07 MED ORDER — PREDNISONE 10 MG PO TABS: 20.0000 mg | ORAL_TABLET | Freq: Every day | ORAL | Status: DC

## 2015-12-07 NOTE — Discharge Instructions (Signed)

## 2015-12-07 NOTE — ED Provider Notes (Signed)
CSN: [id number]     Arrival date & time 12/07/15  1600 History   First MD Initiated Contact with Patient 12/07/15 1959     Chief Complaint  Patient presents with  . Allergic Reaction     (Consider location/radiation/quality/duration/timing/severity/associated sxs/prior Treatment) HPI Comments: Patient presents with a rash. He states this morning he ate a chocolate covered doughnut and broke out in a rash all over. He wanted to verify that his allergy was actually related to the chocolate, so he ate second donut this afternoon in the hives returned. He has no prior history of food allergies. However his father is allergic to chocolate and he feels that it was the chocolate the eighth caused the allergy. He reports a diffuse rash that is itchy. He denies any facial swelling. No tongue swelling. No trouble swallowing. No shortness of breath or wheezing. He took ibuprofen earlier with some improvement in symptoms.  Patient is a [age] y.o. male presenting with allergic reaction.  Allergic Reaction Presenting symptoms: rash     History reviewed. No pertinent past medical history. History reviewed. No pertinent past surgical history. No family history on file. Social History  Substance Use Topics  . Smoking status: Former Games developermoker  . Smokeless tobacco: None  . Alcohol Use: No    Review of Systems  Constitutional: Negative for fever, chills, diaphoresis and fatigue.  HENT: Negative for congestion, facial swelling, rhinorrhea and sneezing.   Eyes: Negative.   Respiratory: Negative for cough, chest tightness and shortness of breath.   Cardiovascular: Negative for chest pain and leg swelling.  Gastrointestinal: Negative for nausea, vomiting, abdominal pain, diarrhea and blood in stool.  Genitourinary: Negative for frequency, hematuria, flank pain and difficulty urinating.  Musculoskeletal: Negative for back pain and arthralgias.  Skin: Positive for rash.  Neurological: Negative for dizziness,  speech difficulty, weakness, numbness and headaches.      Allergies  Chocolate  Home Medications   Prior to Admission medications   Medication Sig Start Date End Date Taking? Authorizing Provider  diphenhydrAMINE (BENADRYL) 25 MG tablet Take 1 tablet (25 mg total) by mouth every 6 (six) hours. Patient not taking: Reported on 06/02/2014 01/08/14   Ivonne AndrewPeter Dammen, PA-C  EPINEPHrine 0.3 mg/0.3 mL IJ SOAJ injection Inject 0.3 mLs (0.3 mg total) into the muscle once. 12/07/15   Rolan BuccoMelanie Augustine Leverette, MD  ibuprofen (ADVIL,MOTRIN) 800 MG tablet Take 1 tablet (800 mg total) by mouth 3 (three) times daily. Patient not taking: Reported on 06/02/2014 11/17/12   Francee PiccoloJennifer Piepenbrink, PA-C  predniSONE (DELTASONE) 10 MG tablet Take 2 tablets (20 mg total) by mouth daily. 12/07/15   Rolan BuccoMelanie Seher Schlagel, MD   BP 106/69 mmHg  Pulse 60  Temp(Src) 98.3 F (36.8 C) (Oral)  Resp 18  SpO2 100% Physical Exam  Constitutional: He is oriented to person, place, and time. He appears well-developed and well-nourished.  HENT:  Head: Normocephalic and atraumatic.  No angioedema  Eyes: Pupils are equal, round, and reactive to light.  Neck: Normal range of motion. Neck supple.  Cardiovascular: Normal rate, regular rhythm and normal heart sounds.   Pulmonary/Chest: Effort normal and breath sounds normal. No respiratory distress. He has no wheezes. He has no rales. He exhibits no tenderness.  Abdominal: Soft. Bowel sounds are normal. There is no tenderness. There is no rebound and no guarding.  Musculoskeletal: Normal range of motion. He exhibits no edema.  Lymphadenopathy:    He has no cervical adenopathy.  Neurological: He is alert and oriented to person, place,  and time.  Skin: Skin is warm and dry. Rash (Diffuse urticaria to trunk and extremities.) noted.  Psychiatric: He has a normal mood and affect.    ED Course  Procedures (including critical care time) Labs Review Labs Reviewed - No data to display  Imaging Review No  results found. I have personally reviewed and evaluated these images and lab results as part of my medical decision-making.   EKG Interpretation None      MDM   Final diagnoses:  Allergic reaction, initial encounter    Patient presents with an allergic reaction. He has no angioedema or shortness of breath. He was started on a prednisone burst. He was also given a prescription for an EpiPen and instructed how to use it should his symptoms worsen. He was advised to use Benadryl as needed for symptomatic relief. Return precautions were given.    Rolan Bucco, MD 12/07/15 2035

## 2015-12-07 NOTE — ED Notes (Addendum)
Patient complains of second episode of hives x 2 days. Took tylenol pm with relief last pm but returned today, no resp. Distress. Hives noted to trunk, no sob

## 2016-08-01 IMAGING — CT CT HEAD W/O CM
2 series · 16 of 30 positions shown, 18 images · non-contrast
Comparison: None.

CLINICAL DATA: Syncope today.  Seizure-like activity.

EXAM:
CT HEAD WITHOUT CONTRAST
TECHNIQUE: Contiguous axial images were obtained from the base of the skull
through the vertex without intravenous contrast.

[Series 201: head w/o, idose (1) · axial · non-contrast · 0.44mm/px · z∈[+138,+243]mm · 8 of 29 slices shown, 10 images]
[im 4/29  brain]
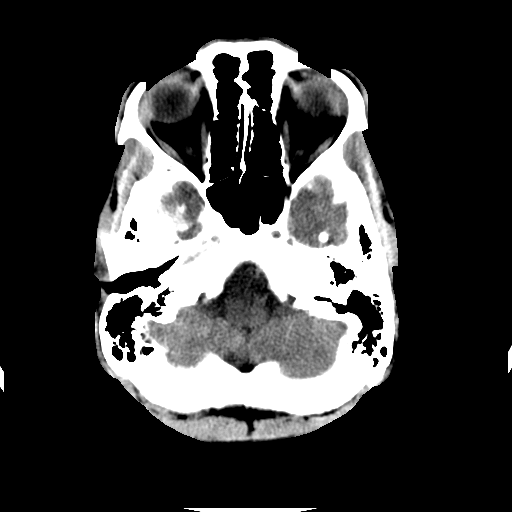
[im 4/29  bone]
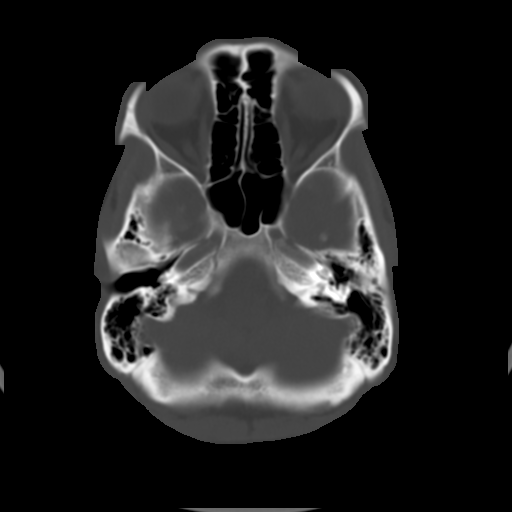
[im 7/29  brain]
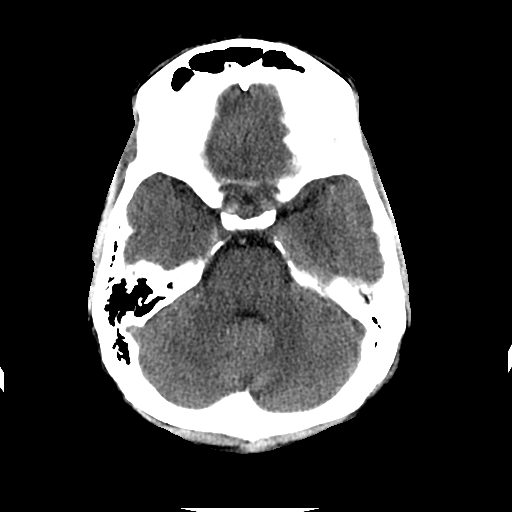
[im 10/29  brain]
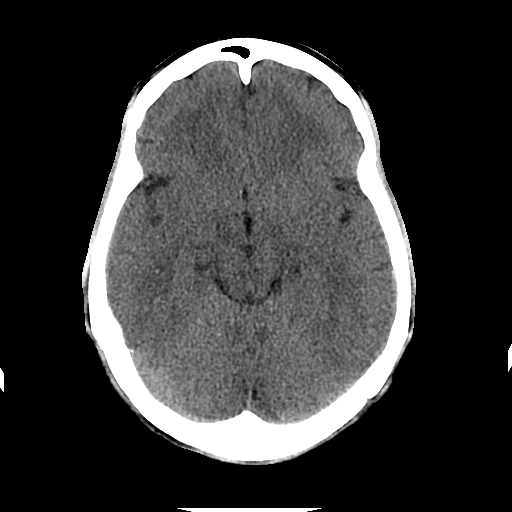
[im 13/29  brain]
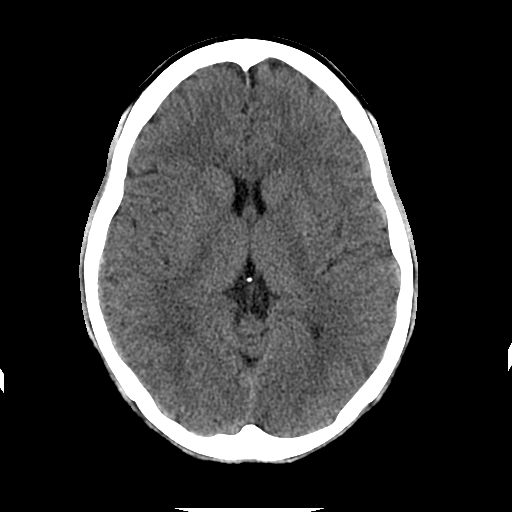
[im 16/29  brain]
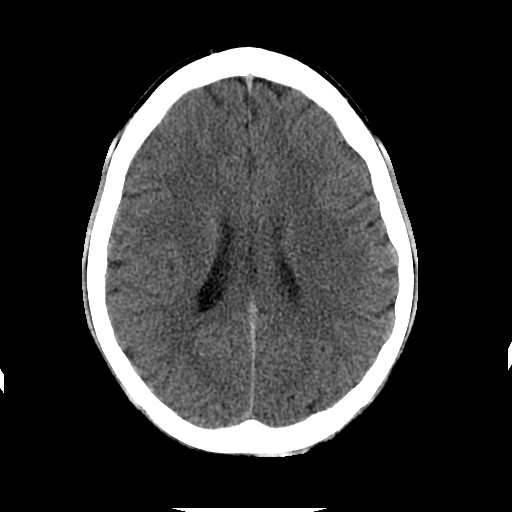
[im 16/29  bone]
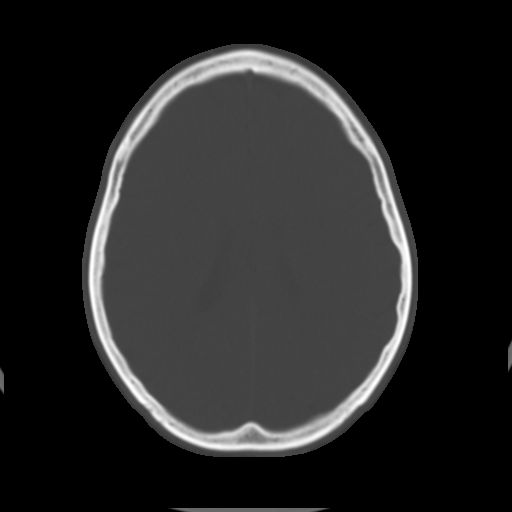
[im 19/29  brain]
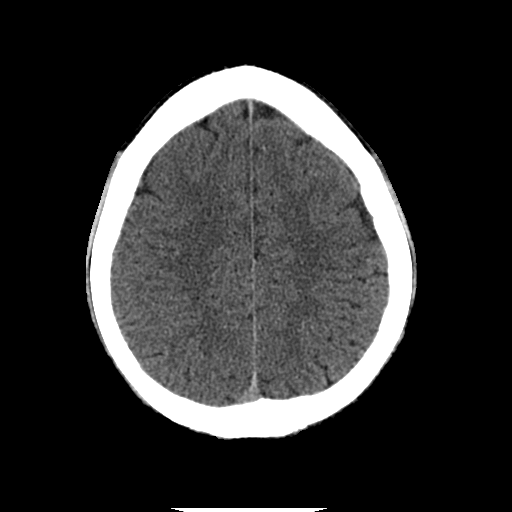
[im 22/29  brain]
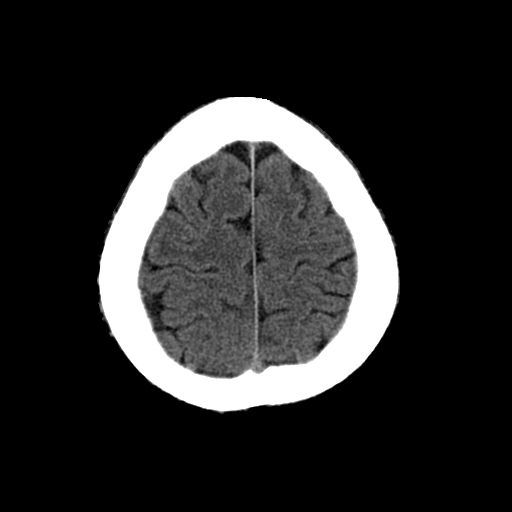
[im 25/29  brain]
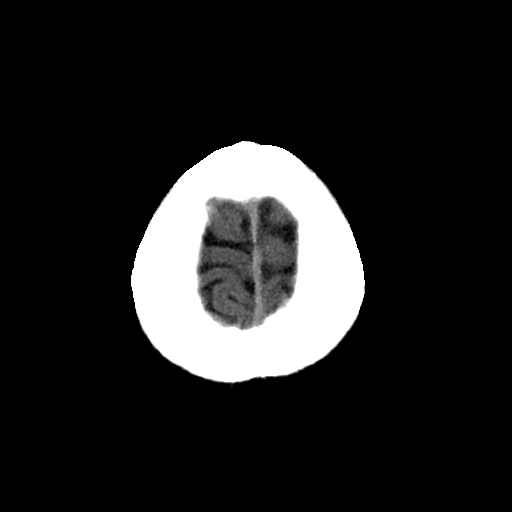

[Series 202: head w/o bone, idose (1) · axial · non-contrast · 0.44mm/px · z∈[+137,+249]mm · 8 of 58 slices shown]
[im 7/58  bone]
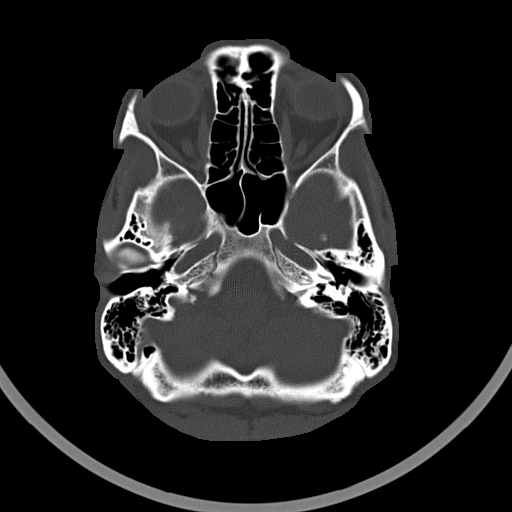
[im 13/58  bone]
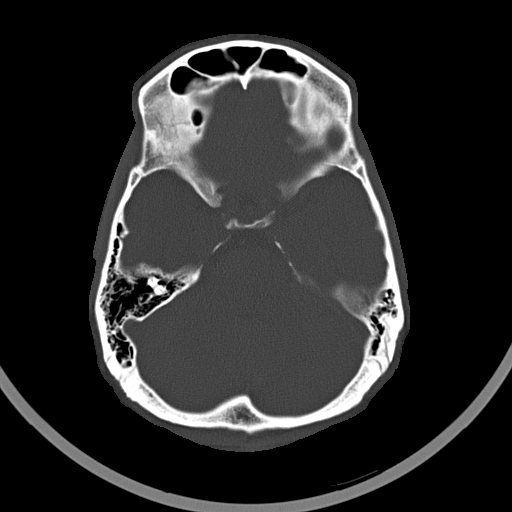
[im 19/58  bone]
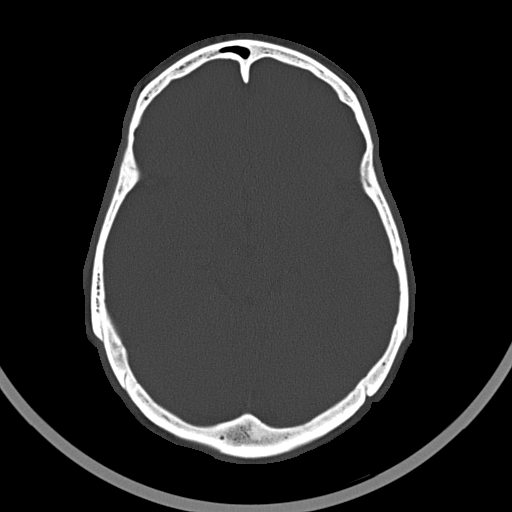
[im 25/58  bone]
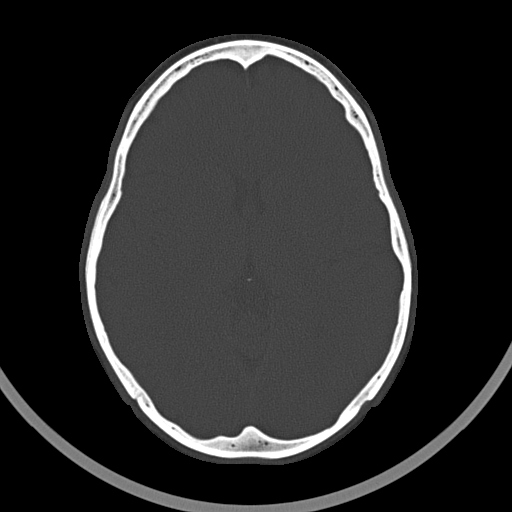
[im 34/58  bone]
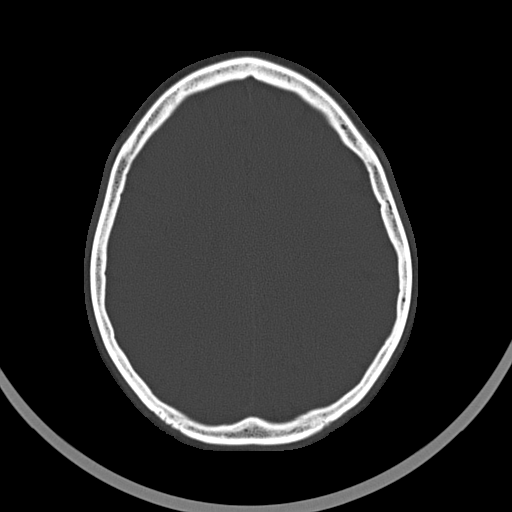
[im 40/58  bone]
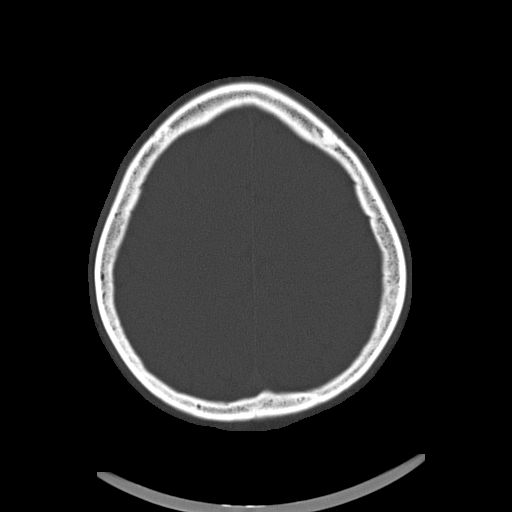
[im 46/58  bone]
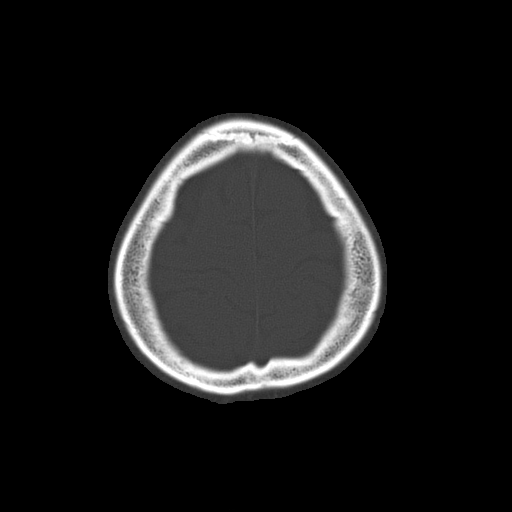
[im 52/58  bone]
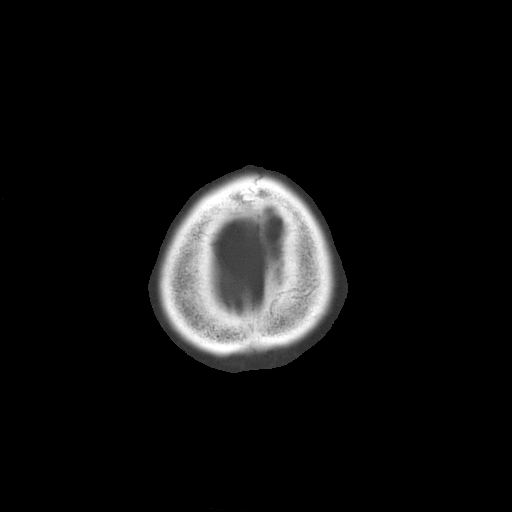

[16 of 30 positions shown; findings below may reference images not displayed]

FINDINGS: No mass lesion. No midline shift. No acute hemorrhage or hematoma.
No extra-axial fluid collections. No evidence of acute infarction.
Brain parenchyma is normal. No osseous abnormality.
IMPRESSION: Normal exam.

## 2016-12-19 ENCOUNTER — Emergency Department (HOSPITAL_COMMUNITY)
Admission: EM | Admit: 2016-12-19 | Discharge: 2016-12-19 | Disposition: A | Discharge status: Home / Self Care | Payer: Medicaid Other | Attending: Emergency Medicine | Admitting: Emergency Medicine

## 2016-12-19 ENCOUNTER — Encounter (HOSPITAL_COMMUNITY): Payer: Self-pay | Admitting: Emergency Medicine

## 2016-12-19 DIAGNOSIS — R0981 Nasal congestion: Secondary | ICD-10-CM | POA: Diagnosis not present

## 2016-12-19 DIAGNOSIS — J329 Chronic sinusitis, unspecified: Secondary | ICD-10-CM

## 2016-12-19 DIAGNOSIS — R51 Headache: Secondary | ICD-10-CM | POA: Diagnosis present

## 2016-12-19 MED ORDER — PSEUDOEPHEDRINE HCL 60 MG PO TABS: 60.0000 mg | ORAL_TABLET | Freq: Four times a day (QID) | ORAL | 0 refills | Status: DC | PRN

## 2016-12-19 MED ORDER — FLUTICASONE PROPIONATE 50 MCG/ACT NA SUSP: 2.0000 | Freq: Every day | NASAL | 2 refills | Status: DC

## 2016-12-19 NOTE — ED Notes (Signed)
Pt assessed and discharged by Tatyana EDPA 

## 2016-12-19 NOTE — ED Triage Notes (Signed)
Pt states he has been having intermittent headaches, nasal congestion, and cough x 3 days

## 2016-12-19 NOTE — Discharge Instructions (Signed)
Continue to take ibuprofen for pain. Take Flonase daily for sinus inflammation. Take Sudafed for congestion. Intranasal saline every 1-2 hours for congestion. Follow-up with family doctor as needed.

## 2016-12-19 NOTE — ED Provider Notes (Signed)
[facility]-EMERGENCY DEPT Provider Note   CSN: [id number] Arrival date & time: 12/19/16  2015     History   Chief Complaint Chief Complaint  Patient presents with  . Headache  . Nasal Congestion    HPI Allen Stafford is a 26 y.o. male.  HPI Allen Stafford is a 26 y.o. male  presents to emergency department complaining of congestion, sinus headache, cough. Patient's symptoms started 3 days ago. He states his symptoms started after his daughter was sick with the same symptoms. He states that he is having frontal headache that radiates to bilateral temple. He is blowing his nose with clear and yellow drainage. He is coughing. Denies shortness of breath. No fever. No other complaints. He has been taking ibuprofen which helps with his headache. He states nothing is making his symptoms worse. No other associated symptoms.  History reviewed. No pertinent past medical history.  There are no active problems to display for this patient.   History reviewed. No pertinent surgical history.     Home Medications    Prior to Admission medications   Medication Sig Start Date End Date Taking? Authorizing Provider  diphenhydrAMINE (BENADRYL) 25 MG tablet Take 1 tablet (25 mg total) by mouth every 6 (six) hours. Patient not taking: Reported on 06/02/2014 01/08/14   Ivonne Andrew, PA-C  EPINEPHrine 0.3 mg/0.3 mL IJ SOAJ injection Inject 0.3 mLs (0.3 mg total) into the muscle once. 12/07/15   Rolan Bucco, MD  ibuprofen (ADVIL,MOTRIN) 800 MG tablet Take 1 tablet (800 mg total) by mouth 3 (three) times daily. Patient not taking: Reported on 06/02/2014 11/17/12   Piepenbrink, Victorino Dike, PA-C  predniSONE (DELTASONE) 10 MG tablet Take 2 tablets (20 mg total) by mouth daily. 12/07/15   Rolan Bucco, MD    Family History Family History  Problem Relation Age of Onset  . Diabetes Mother   . CAD Mother     Social History Social History  Substance Use Topics  . Smoking status: Former Games developer  . Smokeless  tobacco: Never Used  . Alcohol use No     Allergies   Chocolate   Review of Systems Review of Systems  Constitutional: Negative for chills and fever.  HENT: Positive for congestion, rhinorrhea, sinus pain and sore throat.   Respiratory: Positive for cough. Negative for chest tightness and shortness of breath.   Cardiovascular: Negative for chest pain, palpitations and leg swelling.  Gastrointestinal: Negative for abdominal distention, abdominal pain, diarrhea, nausea and vomiting.  Genitourinary: Negative for dysuria, frequency, hematuria and urgency.  Musculoskeletal: Negative for arthralgias, myalgias, neck pain and neck stiffness.  Skin: Negative for rash.  Allergic/Immunologic: Negative for immunocompromised state.  Neurological: Positive for headaches. Negative for dizziness, weakness, light-headedness and numbness.  All other systems reviewed and are negative.    Physical Exam Updated Vital Signs BP 131/81 (BP Location: Left Arm)   Pulse (!) 51   Temp 98.5 F (36.9 C) (Oral)   Resp 20   Ht 5\' 10"  (1.778 m)   Wt 84.9 kg (187 lb 3 oz)   SpO2 100%   BMI 26.86 kg/m   Physical Exam  Constitutional: He appears well-developed and well-nourished. No distress.  HENT:  Head: Normocephalic and atraumatic.  Rhinorrhea bilaterally, clear. Frontal and maxillary sinus tenderness bilaterally. Oropharynx normal. No exudate.  Eyes: Conjunctivae are normal.  Neck: Neck supple.  Cardiovascular: Normal rate, regular rhythm and normal heart sounds.   Pulmonary/Chest: Effort normal. No respiratory distress. He has no wheezes. He has no rales.  Abdominal: Soft. Bowel sounds are normal. He exhibits no distension. There is no tenderness. There is no rebound.  Musculoskeletal: He exhibits no edema.  Neurological: He is alert.  Skin: Skin is warm and dry.  Nursing note and vitals reviewed.    ED Treatments / Results  Labs (all labs ordered are listed, but only abnormal results are  displayed) Labs Reviewed - No data to display  EKG  EKG Interpretation None       Radiology No results found.  Procedures Procedures (including critical care time)  Medications Ordered in ED Medications - No data to display   Initial Impression / Assessment and Plan / ED Course  I have reviewed the triage vital signs and the nursing notes.  Pertinent labs & imaging results that were available during my care of the patient were reviewed by me and considered in my medical decision making (see chart for details).   patient with clear to yellow rhinorrhea, sinus pain, symptoms for 3 days. Afebrile. Otherwise nontoxic appearing. Symptoms and exam was consistent with viral sinusitis. Will treat with intranasal saline, Sudafed, Flonase. Ibuprofen for pain. Follow-up with family doctor as needed.  Vitals:   12/19/16 2030  BP: 131/81  Pulse: (!) 51  Resp: 20  Temp: 98.5 F (36.9 C)  TempSrc: Oral  SpO2: 100%  Weight: 84.9 kg (187 lb 3 oz)  Height: 5\' 10"  (1.778 m)     Final Clinical Impressions(s) / ED Diagnoses   Final diagnoses:  Sinusitis, unspecified chronicity, unspecified location    New Prescriptions New Prescriptions   FLUTICASONE (FLONASE) 50 MCG/ACT NASAL SPRAY    Place 2 sprays into both nostrils daily.   PSEUDOEPHEDRINE (SUDAFED) 60 MG TABLET    Take 1 tablet (60 mg total) by mouth every 6 (six) hours as needed for congestion.     Jaynie CrumbleKirichenko, Sheralee Qazi, PA-C 12/19/16 2243    Doug SouJacubowitz, Sam, MD 12/20/16 [phone]

## 2016-12-19 NOTE — ED Notes (Signed)
Pt was in a rush to leave because their ride is here, did not obtain VS prior to d/c

## 2017-06-15 ENCOUNTER — Emergency Department (HOSPITAL_COMMUNITY)
Admission: EM | Admit: 2017-06-15 | Discharge: 2017-06-15 | Disposition: A | Discharge status: Home / Self Care | Payer: Medicaid Other | Attending: Emergency Medicine | Admitting: Emergency Medicine

## 2017-06-15 ENCOUNTER — Other Ambulatory Visit: Payer: Self-pay

## 2017-06-15 ENCOUNTER — Encounter (HOSPITAL_COMMUNITY): Payer: Self-pay

## 2017-06-15 DIAGNOSIS — M549 Dorsalgia, unspecified: Secondary | ICD-10-CM | POA: Diagnosis present

## 2017-06-15 DIAGNOSIS — Z79899 Other long term (current) drug therapy: Secondary | ICD-10-CM | POA: Insufficient documentation

## 2017-06-15 DIAGNOSIS — Z87891 Personal history of nicotine dependence: Secondary | ICD-10-CM | POA: Insufficient documentation

## 2017-06-15 DIAGNOSIS — M545 Low back pain, unspecified: Secondary | ICD-10-CM

## 2017-06-15 LAB — URINALYSIS, ROUTINE W REFLEX MICROSCOPIC
Bilirubin Urine: NEGATIVE
GLUCOSE, UA: NEGATIVE mg/dL
KETONES UR: NEGATIVE mg/dL
LEUKOCYTES UA: NEGATIVE
Nitrite: NEGATIVE
PROTEIN: NEGATIVE mg/dL
Specific Gravity, Urine: 1.026 (ref 1.005–1.030)
pH: 5 (ref 5.0–8.0)

## 2017-06-15 MED ORDER — METHOCARBAMOL 500 MG PO TABS: 500.0000 mg | ORAL_TABLET | Freq: Two times a day (BID) | ORAL | 0 refills | Status: DC

## 2017-06-15 MED ORDER — NAPROXEN 500 MG PO TABS: 500.0000 mg | ORAL_TABLET | Freq: Two times a day (BID) | ORAL | 0 refills | Status: DC

## 2017-06-15 NOTE — Discharge Instructions (Signed)
Back Pain:  I have prescribed you an anti-inflammatory medication and a muscle relaxer.   Naproxen is a nonsteroidal anti-inflammatory medication that will help with pain and swelling. Be sure to take this medication as prescribed with food, 1 pill every 12 hours,  It should be taken with food, as it can cause stomach upset, and more seriously, stomach bleeding. Do not take other nonsteroidal anti-inflammatory medications with this such as Advil, Motrin, or Aleve.   Robaxin is the muscle relaxer I have prescribed, this is meant to help with muscle tightness. Be aware that this medication may make you drowsy therefore the first time you take this it should be at a time you are in an environment where you can rest. Do not drive or operate heavy machinery when taking this medication.   In addition you may also take Tylenol. Tylenol is generally safe, though you should not take more than 8 of the extra strength (500mg ) pills a day.  Asked the pharmacist at the drugstore about over the counter topical lidoderm patches.   The application of heat can help soothe the pain.  Maintaining your daily activities, including walking, is encourged, as it will help you get better faster than just staying in bed.  Low back pain is discomfort in the lower back that may be due to injuries to muscles and ligaments around the spine.  Occasionally, it may be caused by a a problem to a part of the spine called a disc.  The pain may last several days or a few weeks.   Your pain should get better over the next 2 weeks.  You will need to follow up with  Your primary healthcare provider in 1-2 weeks for reassessment, if you do not have a primary care provider one is provided in your discharge instructions. However if you develop severe or worsening pain, low back pain with fever, numbness, weakness, loss of bowel or bladder control, or inability to walk or urinate, you should return to the ER immediately.  Please follow up with  your doctor this week for a recheck if still having symptoms.

## 2017-06-15 NOTE — ED Provider Notes (Signed)
[facility]   CSN: [id number] Arrival date & time: 06/15/17  1416     History   Chief Complaint Chief Complaint  Patient presents with  . Back Pain    HPI Allen Stafford is a 26 y.o. male without significant past medical history who presents the emergency department complaining of acute on chronic back pain that worsened yesterday.  Patient states that over the past 3 years he has had intermittent problems with pain in his back related to heavy lifting and increased activity, has had periods of time without pain, has not had this evaluated previously.  Describes the pain as being to the right lower back, nonradiating, sharp.  States that is worse with certain movements and certain sitting positions, has improved with Tylenol and with heat application.  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 Denies numbness, tingling, weakness, incontinence to bowel/bladder, fever, chills, IV drug use, or hx of cancer.   HPI  History reviewed. No pertinent past medical history.  There are no active problems to display for this patient.   History reviewed. No pertinent surgical history.     Home Medications    Prior to Admission medications   Medication Sig Start Date End Date Taking? Authorizing Provider  diphenhydrAMINE (BENADRYL) 25 MG tablet Take 1 tablet (25 mg total) by mouth every 6 (six) hours. Patient not taking: Reported on 06/02/2014 01/08/14   Ivonne Andrewammen, Peter, PA-C  EPINEPHrine 0.3 mg/0.3 mL IJ SOAJ injection Inject 0.3 mLs (0.3 mg total) into the muscle once. 12/07/15   Rolan BuccoBelfi, Melanie, MD  fluticasone (FLONASE) 50 MCG/ACT nasal spray Place 2 sprays into both nostrils daily. 12/19/16   Kirichenko, Tatyana, PA-C  ibuprofen (ADVIL,MOTRIN) 800 MG tablet Take 1 tablet (800 mg total) by mouth 3 (three) times daily. Patient not taking: Reported on 06/02/2014 11/17/12    Piepenbrink, Victorino DikeJennifer, PA-C  predniSONE (DELTASONE) 10 MG tablet Take 2 tablets (20 mg total) by mouth daily. 12/07/15   Rolan BuccoBelfi, Melanie, MD  pseudoephedrine (SUDAFED) 60 MG tablet Take 1 tablet (60 mg total) by mouth every 6 (six) hours as needed for congestion. 12/19/16   Jaynie CrumbleKirichenko, Tatyana, PA-C    Family History Family History  Problem Relation Age of Onset  . Diabetes Mother   . CAD Mother     Social History Social History   Tobacco Use  . Smoking status: Former Games developermoker  . Smokeless tobacco: Never Used  Substance Use Topics  . Alcohol use: No  . Drug use: Yes    Types: Marijuana     Allergies   Chocolate   Review of Systems Review of Systems  Constitutional: Negative for chills and fever.  Genitourinary: Negative for dysuria, hematuria, scrotal swelling and testicular pain.  Neurological: Negative for weakness and numbness.       Negative for incontinence     Physical Exam Updated Vital Signs BP (!) 136/99 (BP Location: Right Arm)   Pulse 73   Temp 98.2 F (36.8 C) (Oral)   Resp 16   Ht 5\' 9"  (1.753 m)   Wt 76.7 kg (169 lb)   SpO2 100%   BMI 24.96 kg/m   Physical Exam  Constitutional: He appears well-developed and well-nourished. No distress.  HENT:  Head: Normocephalic and atraumatic.  Eyes: Conjunctivae are normal. Right eye exhibits no discharge. Left eye exhibits no discharge.  Cardiovascular:  Pulses:  Radial pulses are 2+ on the right side, and 2+ on the left side.       Posterior tibial pulses are 2+ on the right side, and 2+ on the left side.  Musculoskeletal:  Back: No midline/vertebral tenderness. Right lumbar paraspinal muscle tenderness to palpation. No CVA tenderness.   Neurological:  Alert. Clear speech. Bilateral upper and lower extremities' sensation intact to sharp and dull touch. 5/5 grip strength bilaterally. 5/5 hip flexion, knee flexion/extension, and ankle plantar and dorsi flexion bilaterally. Patellar DTRs are 2+ and  symmetric. Gait is antalgic, but intact. Patient is able to ambulate on heels and toes.   Psychiatric: He has a normal mood and affect. His behavior is normal. Thought content normal.  Nursing note and vitals reviewed.   ED Treatments / Results  Labs (all labs ordered are listed, but only abnormal results are displayed) Labs Reviewed  URINALYSIS, ROUTINE W REFLEX MICROSCOPIC - Abnormal; Notable for the following components:      Result Value   Hgb urine dipstick SMALL (*)    Bacteria, UA RARE (*)    Squamous Epithelial / LPF 0-5 (*)    All other components within normal limits   EKG  EKG Interpretation None      Radiology No results found.  Procedures Procedures (including critical care time)  Medications Ordered in ED Medications - No data to display  Initial Impression / Assessment and Plan / ED Course  I have reviewed the triage vital signs and the nursing notes.  Pertinent labs & imaging results that were available during my care of the patient were reviewed by me and considered in my medical decision making (see chart for details).    Patient presents with complaint of back pain.  He is nontoxic appearing, in no apparent distress, with stable vital signs. No midline vertebral tenderness, no specific injury, doubt vertebral fracture or dislocation. No neurological deficits and normal neuro exam.  Patient can ambulate but states is painful, also able to ambulate on toes and heels.  No loss of bowel or bladder control.  No concern for cauda equina.  No fever, night sweats, weight loss, h/o cancer, IVDU. UA without signs of cystitis/pyelonephritis. Will treat with Naproxen and Robaxin.  Instructed patient he should not drive or operate heavy machinery while taking Robaxin.  I discussed results, treatment plan, need for PCP follow-up, and return precautions with the patient. Provided opportunity for questions, patient confirmed understanding and is in agreement with plan.    Final Clinical Impressions(s) / ED Diagnoses   Final diagnoses:  Acute right-sided low back pain without sciatica    ED Discharge Orders        Ordered    naproxen (NAPROSYN) 500 MG tablet  2 times daily     06/15/17 1619    methocarbamol (ROBAXIN) 500 MG tablet  2 times daily     06/15/17 1619       Cherly Andersonetrucelli, Ellijah Leffel R, PA-C 06/15/17 1831    Lavera GuiseLiu, Dana Duo, MD 06/18/17 [phone]

## 2017-06-15 NOTE — ED Triage Notes (Signed)
Per Pt, Pt is coming from home with complaints of lower back pain that started three years ago. Pt reports that he works a lot and takes care of his kids.

## 2017-07-16 ENCOUNTER — Ambulatory Visit: Payer: Self-pay | Admitting: Family Medicine

## 2017-09-25 ENCOUNTER — Emergency Department (HOSPITAL_COMMUNITY): Payer: Medicaid Other

## 2017-09-25 ENCOUNTER — Emergency Department (HOSPITAL_COMMUNITY)
Admission: EM | Admit: 2017-09-25 | Discharge: 2017-09-25 | Disposition: A | Discharge status: Home / Self Care | Payer: Medicaid Other | Attending: Emergency Medicine | Admitting: Emergency Medicine

## 2017-09-25 ENCOUNTER — Other Ambulatory Visit: Payer: Self-pay

## 2017-09-25 ENCOUNTER — Encounter (HOSPITAL_COMMUNITY): Payer: Self-pay | Admitting: *Deleted

## 2017-09-25 DIAGNOSIS — Z87891 Personal history of nicotine dependence: Secondary | ICD-10-CM | POA: Insufficient documentation

## 2017-09-25 DIAGNOSIS — R079 Chest pain, unspecified: Secondary | ICD-10-CM | POA: Diagnosis present

## 2017-09-25 DIAGNOSIS — Z79899 Other long term (current) drug therapy: Secondary | ICD-10-CM | POA: Insufficient documentation

## 2017-09-25 DIAGNOSIS — F419 Anxiety disorder, unspecified: Secondary | ICD-10-CM | POA: Diagnosis not present

## 2017-09-25 LAB — BASIC METABOLIC PANEL
ANION GAP: 11 (ref 5–15)
BUN: 13 mg/dL (ref 6–20)
CO2: 24 mmol/L (ref 22–32)
Calcium: 9.3 mg/dL (ref 8.9–10.3)
Chloride: 104 mmol/L (ref 101–111)
Creatinine, Ser: 1.05 mg/dL (ref 0.61–1.24)
Glucose, Bld: 84 mg/dL (ref 65–99)
POTASSIUM: 3.3 mmol/L — AB (ref 3.5–5.1)
Sodium: 139 mmol/L (ref 135–145)

## 2017-09-25 LAB — I-STAT TROPONIN, ED: Troponin i, poc: 0.01 ng/mL (ref 0.00–0.08)

## 2017-09-25 LAB — CBC
HCT: 42.6 % (ref 39.0–52.0)
HEMOGLOBIN: 13.7 g/dL (ref 13.0–17.0)
MCH: 27.5 pg (ref 26.0–34.0)
MCHC: 32.2 g/dL (ref 30.0–36.0)
MCV: 85.4 fL (ref 78.0–100.0)
Platelets: 317 10*3/uL (ref 150–400)
RBC: 4.99 MIL/uL (ref 4.22–5.81)
RDW: 13.7 % (ref 11.5–15.5)
WBC: 4.3 10*3/uL (ref 4.0–10.5)

## 2017-09-25 NOTE — ED Provider Notes (Signed)
[facility] EMERGENCY DEPARTMENT Provider Note   CSN: [id number] Arrival date & time: 09/25/17  1011     History   Chief Complaint Chief Complaint  Patient presents with  . Chest Pain    HPI Allen Stafford is a 27 y.o. male.  HPI   27 year old male presents today with complaints of chest pain and anxiety.  Patient notes that over the last year he has had centralized chest tightness, worse with anxiety.  He notes he was out trying to find a job today which caused significant anxiety, he reports this became so severe that he passed out.  Patient notes he is no longer having significant chest discomfort, denies any shortness of breath.  Denies any history of cardiac disease, reports he smokes marijuana but does not smoke cigarettes.  Patient denies any history of DVT or PE or any significant risk factors.  Patient denies any recent infectious etiology.  History reviewed. No pertinent past medical history.  There are no active problems to display for this patient.   History reviewed. No pertinent surgical history.      Home Medications    Prior to Admission medications   Medication Sig Start Date End Date Taking? Authorizing Provider  EPINEPHrine 0.3 mg/0.3 mL IJ SOAJ injection Inject 0.3 mLs (0.3 mg total) into the muscle once. 12/07/15   Rolan Bucco, MD  fluticasone (FLONASE) 50 MCG/ACT nasal spray Place 2 sprays into both nostrils daily. 12/19/16   Kirichenko, Tatyana, PA-C  methocarbamol (ROBAXIN) 500 MG tablet Take 1 tablet (500 mg total) by mouth 2 (two) times daily. 06/15/17   Petrucelli, Samantha R, PA-C  naproxen (NAPROSYN) 500 MG tablet Take 1 tablet (500 mg total) by mouth 2 (two) times daily. 06/15/17   Petrucelli, Samantha R, PA-C  predniSONE (DELTASONE) 10 MG tablet Take 2 tablets (20 mg total) by mouth daily. 12/07/15   Rolan Bucco, MD  pseudoephedrine (SUDAFED) 60 MG tablet Take 1 tablet (60 mg total) by mouth every 6 (six) hours as needed for  congestion. 12/19/16   Jaynie Crumble, PA-C    Family History Family History  Problem Relation Age of Onset  . Diabetes Mother   . CAD Mother     Social History Social History   Tobacco Use  . Smoking status: Former Games developer  . Smokeless tobacco: Never Used  Substance Use Topics  . Alcohol use: No  . Drug use: Yes    Types: Marijuana     Allergies   Chocolate   Review of Systems Review of Systems  All other systems reviewed and are negative.    Physical Exam Updated Vital Signs BP 111/74 (BP Location: Right Arm)   Pulse 61   Temp 97.9 F (36.6 C) (Oral)   Resp 18   Ht 5\' 9"  (1.753 m)   Wt 77.1 kg (170 lb)   SpO2 100%   BMI 25.10 kg/m   Physical Exam  Constitutional: He is oriented to person, place, and time. He appears well-developed and well-nourished.  HENT:  Head: Normocephalic and atraumatic.  Eyes: Pupils are equal, round, and reactive to light. Conjunctivae are normal. Right eye exhibits no discharge. Left eye exhibits no discharge. No scleral icterus.  Neck: Normal range of motion. No JVD present. No tracheal deviation present.  Cardiovascular: Normal rate, regular rhythm, normal heart sounds and intact distal pulses. Exam reveals no gallop and no friction rub.  No murmur heard. Pulmonary/Chest: Effort normal and breath sounds normal. No stridor. No respiratory distress.  He has no wheezes. He has no rales. He exhibits no tenderness.  Musculoskeletal: He exhibits no edema.  Neurological: He is alert and oriented to person, place, and time. Coordination normal.  Psychiatric: He has a normal mood and affect. His behavior is normal. Judgment and thought content normal.  Nursing note and vitals reviewed.    ED Treatments / Results  Labs (all labs ordered are listed, but only abnormal results are displayed) Labs Reviewed  BASIC METABOLIC PANEL - Abnormal; Notable for the following components:      Result Value   Potassium 3.3 (*)    All other  components within normal limits  CBC  I-STAT TROPONIN, ED    EKG None  Radiology Dg Chest 2 View  Result Date: 09/25/2017 CLINICAL DATA:  Right-sided chest pain, recent cough and congestion EXAM: CHEST - 2 VIEW COMPARISON:  Chest x-ray of 12/12/2008 FINDINGS: No active infiltrate or effusion is seen. Mediastinal and hilar contours are unremarkable. The heart is within normal limits in size. No bony abnormality is seen. IMPRESSION: No active cardiopulmonary disease. Electronically Signed   By: Dwyane DeePaul  Barry M.D.   On: 09/25/2017 10:41    Procedures Procedures (including critical care time)  Medications Ordered in ED Medications - No data to display   Initial Impression / Assessment and Plan / ED Course  I have reviewed the triage vital signs and the nursing notes.  Pertinent labs & imaging results that were available during my care of the patient were reviewed by me and considered in my medical decision making (see chart for details).    Labs: I-STAT troponin, BMP, CBC  Imaging: DG chest 2 view /ED EKG reviewed sinus bradycardia no acute abnormalities  Consults:  Therapeutics:  Discharge Meds:   Assessment/Plan: 27 year old male presents today with likely anxiety.  Patient is well-appearing in no acute distress.  He has reassuring laboratory analysis with normal troponin, reassuring EKG.  I have very low suspicion for ACS, arrhythmia, PE or any other life-threatening etiology.  Stress reduction exercises given, strict return precautions given.  Patient verbalized understanding and agreement to today's plan.   Final Clinical Impressions(s) / ED Diagnoses   Final diagnoses:  Anxiety    ED Discharge Orders    None       Rosalio LoudHedges, Nation Cradle, PA-C 09/25/17 1507    Benjiman CorePickering, Nathan, MD 09/25/17 2210

## 2017-09-25 NOTE — Discharge Instructions (Addendum)
Please read attached information. If you experience any new or worsening signs or symptoms please return to the emergency room for evaluation. Please follow-up with your primary care provider or specialist as discussed.  °

## 2017-09-25 NOTE — ED Triage Notes (Signed)
Pt states this am he was normal and went to look for job and states he has been pushing his body till he was stressing.  Pt states he had chest pain this am and passed out.  Pt states it has never happened before

## 2019-06-25 ENCOUNTER — Other Ambulatory Visit: Payer: Self-pay

## 2019-06-25 ENCOUNTER — Emergency Department (HOSPITAL_COMMUNITY)
Admission: EM | Admit: 2019-06-25 | Discharge: 2019-06-25 | Disposition: A | Discharge status: Home / Self Care | Payer: Medicaid Other | Attending: Emergency Medicine | Admitting: Emergency Medicine

## 2019-06-25 DIAGNOSIS — H6692 Otitis media, unspecified, left ear: Secondary | ICD-10-CM | POA: Insufficient documentation

## 2019-06-25 DIAGNOSIS — Z87891 Personal history of nicotine dependence: Secondary | ICD-10-CM | POA: Insufficient documentation

## 2019-06-25 DIAGNOSIS — H9202 Otalgia, left ear: Secondary | ICD-10-CM | POA: Diagnosis present

## 2019-06-25 MED ORDER — AMOXICILLIN-POT CLAVULANATE 875-125 MG PO TABS: 1.0000 | ORAL_TABLET | Freq: Two times a day (BID) | ORAL | 0 refills | Status: DC

## 2019-06-25 MED ORDER — IBUPROFEN 200 MG PO TABS
600.0000 mg | ORAL_TABLET | Freq: Once | ORAL | Status: AC
  Administered 2019-06-25: 12:00:00 600 mg via ORAL
  Filled 2019-06-25: qty 3

## 2019-06-25 MED ORDER — IBUPROFEN 600 MG PO TABS: 600.0000 mg | ORAL_TABLET | Freq: Four times a day (QID) | ORAL | 0 refills | Status: DC | PRN

## 2019-06-25 NOTE — ED Provider Notes (Signed)
[facility] DEPT Provider Note   CSN: [id number] Arrival date & time: 06/25/19  1047     History Chief Complaint  Patient presents with  . Otalgia    Allen Stafford is a 28 y.o. male.  Allen Stafford is a 28 y.o. male who is otherwise healthy, presents to the ED for evaluation of left ear pain.  Symptoms have been present for 2-3 days.  He reports it is a constant dull ache in the ear.  He denies any associated change in hearing or drainage from the ear.  He denies any associated fevers.  Has had some intermittent nasal congestion, but denies rhinorrhea, sore throat, cough, or headache.  He denies history of frequent ear infections.  He is taken some Tylenol without much improvement, has not tried anything else to treat his symptoms.  No other aggravating or alleviating factors.  The history is provided by the patient.  Otalgia Location:  Left Behind ear:  No abnormality Quality:  Aching and dull Severity:  Moderate Onset quality:  Gradual Duration:  3 days Timing:  Constant Progression:  Worsening Chronicity:  New Context: not direct blow and not foreign body in ear   Relieved by:  Nothing Ineffective treatments:  OTC medications Associated symptoms: congestion   Associated symptoms: no cough, no fever, no headaches, no rhinorrhea, no sore throat and no vomiting        No past medical history on file.  There are no problems to display for this patient.   No past surgical history on file.     Family History  Problem Relation Age of Onset  . Diabetes Mother   . CAD Mother     Social History   Tobacco Use  . Smoking status: Former Research scientist (life sciences)  . Smokeless tobacco: Never Used  Substance Use Topics  . Alcohol use: No  . Drug use: Yes    Types: Marijuana    Home Medications Prior to Admission medications   Medication Sig Start Date End Date Taking? Authorizing Provider  amoxicillin-clavulanate (AUGMENTIN) 875-125 MG tablet Take 1  tablet by mouth 2 (two) times daily. One po bid x 7 days 06/25/19   Allen Larsen, PA-C  EPINEPHrine 0.3 mg/0.3 mL IJ SOAJ injection Inject 0.3 mLs (0.3 mg total) into the muscle once. 12/07/15   Malvin Johns, MD  fluticasone (FLONASE) 50 MCG/ACT nasal spray Place 2 sprays into both nostrils daily. 12/19/16   Stafford, Tatyana, PA-C  ibuprofen (ADVIL) 600 MG tablet Take 1 tablet (600 mg total) by mouth every 6 (six) hours as needed. 06/25/19   Allen Larsen, PA-C  methocarbamol (ROBAXIN) 500 MG tablet Take 1 tablet (500 mg total) by mouth 2 (two) times daily. 06/15/17   Stafford, Allen R, PA-C  naproxen (NAPROSYN) 500 MG tablet Take 1 tablet (500 mg total) by mouth 2 (two) times daily. 06/15/17   Stafford, Allen R, PA-C  predniSONE (DELTASONE) 10 MG tablet Take 2 tablets (20 mg total) by mouth daily. 12/07/15   Malvin Johns, MD  pseudoephedrine (SUDAFED) 60 MG tablet Take 1 tablet (60 mg total) by mouth every 6 (six) hours as needed for congestion. 12/19/16   Stafford, Allen Rocker, PA-C    Allergies    Chocolate  Review of Systems   Review of Systems  Constitutional: Negative for chills and fever.  HENT: Positive for congestion and ear pain. Negative for rhinorrhea and sore throat.   Respiratory: Negative for cough.   Gastrointestinal: Negative for nausea and vomiting.  Neurological: Negative for headaches.  All other systems reviewed and are negative.   Physical Exam Updated Vital Signs BP (!) 137/97 (BP Location: Left Arm)   Pulse (!) 58   Temp 97.9 F (36.6 C) (Oral)   Resp 16   SpO2 100%   Physical Exam Vitals and nursing note reviewed.  Constitutional:      General: He is not in acute distress.    Appearance: He is well-developed. He is not ill-appearing or diaphoretic.  HENT:     Head: Normocephalic and atraumatic.     Right Ear: Tympanic membrane, ear canal and external ear normal.     Left Ear: Ear canal and external ear normal. Tympanic membrane is  erythematous and bulging.     Ears:     Comments: Left TM erythematous and bulging consistent with otitis media    Mouth/Throat:     Mouth: Mucous membranes are moist.  Eyes:     General:        Right eye: No discharge.        Left eye: No discharge.  Neck:     Comments: No rigidity Pulmonary:     Effort: Pulmonary effort is normal. No respiratory distress.  Musculoskeletal:        General: No deformity.     Cervical back: Neck supple.  Lymphadenopathy:     Cervical: No cervical adenopathy.  Skin:    General: Skin is warm and dry.     Capillary Refill: Capillary refill takes less than 2 seconds.  Neurological:     Mental Status: He is alert and oriented to person, place, and time.     Coordination: Coordination normal.  Psychiatric:        Mood and Affect: Mood normal.        Behavior: Behavior normal.     ED Results / Procedures / Treatments   Labs (all labs ordered are listed, but only abnormal results are displayed) Labs Reviewed - No data to display  EKG None  Radiology No results found.  Procedures Procedures (including critical care time)  Medications Ordered in ED Medications  ibuprofen (ADVIL) tablet 600 mg (has no administration in time range)    ED Course  I have reviewed the triage vital signs and the nursing notes.  Pertinent labs & imaging results that were available during my care of the patient were reviewed by me and considered in my medical decision making (see chart for details).    MDM Rules/Calculators/A&P                     Patient presents with left otalgia and exam consistent with acute otitis media. No concern for acute mastoiditis, meningitis.  Will treat with Augmentin.  Encouraged ibuprofen and Tylenol for pain.  Advised for PCP follow-up if symptoms or not improving.  Return precautions discussed.  Patient expresses understanding and agreement with plan.  Discharged home in good condition.  Final Clinical Impression(s) / ED  Diagnoses Final diagnoses:  Left otitis media, unspecified otitis media type    Rx / DC Orders ED Discharge Orders         Ordered    amoxicillin-clavulanate (AUGMENTIN) 875-125 MG tablet  2 times daily     06/25/19 1118    ibuprofen (ADVIL) 600 MG tablet  Every 6 hours PRN     06/25/19 1118           Dartha Lodge, [place] 06/25/19 1124    Madilyn Hook,  Lanora ManisElizabeth, MD 06/26/19 1527

## 2019-06-25 NOTE — ED Triage Notes (Signed)
Pt reports left ear pain.  Patient denies SOB, headache, throat pain, or cough at this time.

## 2019-06-25 NOTE — Discharge Instructions (Addendum)
Take antibiotics twice daily as directed with food.  Use ibuprofen 600 mg every 6 hours, Tylenol 1000 mg every 6 hours to treat your symptoms.  If symptoms are not improving over the next few days please follow-up with your primary care doctor.

## 2019-11-25 IMAGING — CR DG CHEST 2V
2 series · 2 of 2 positions shown · non-contrast
Comparison: Chest x-ray of 12/12/2008

CLINICAL DATA: Right-sided chest pain, recent cough and congestion

EXAM:
CHEST - 2 VIEW

[chest pa]
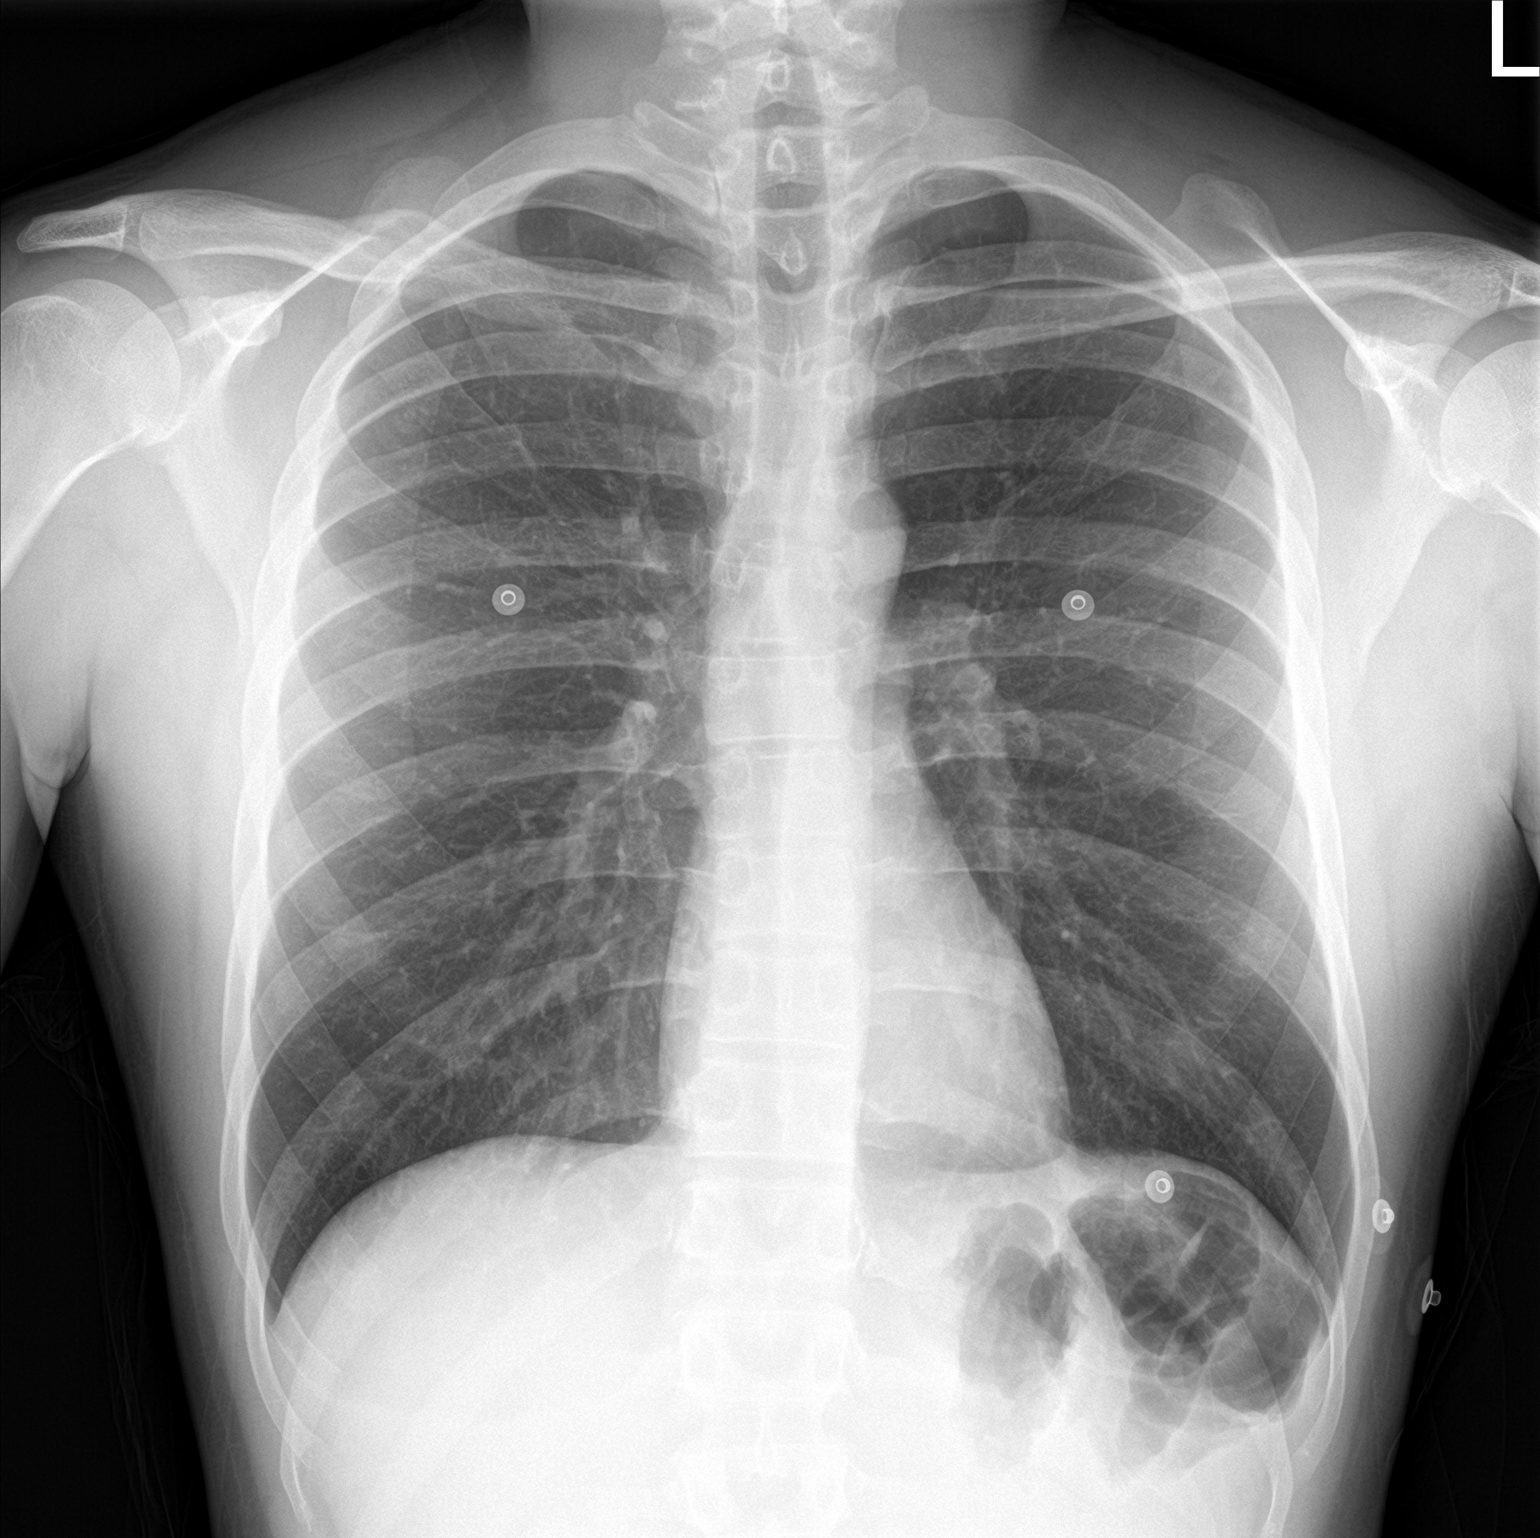

[chest lat]
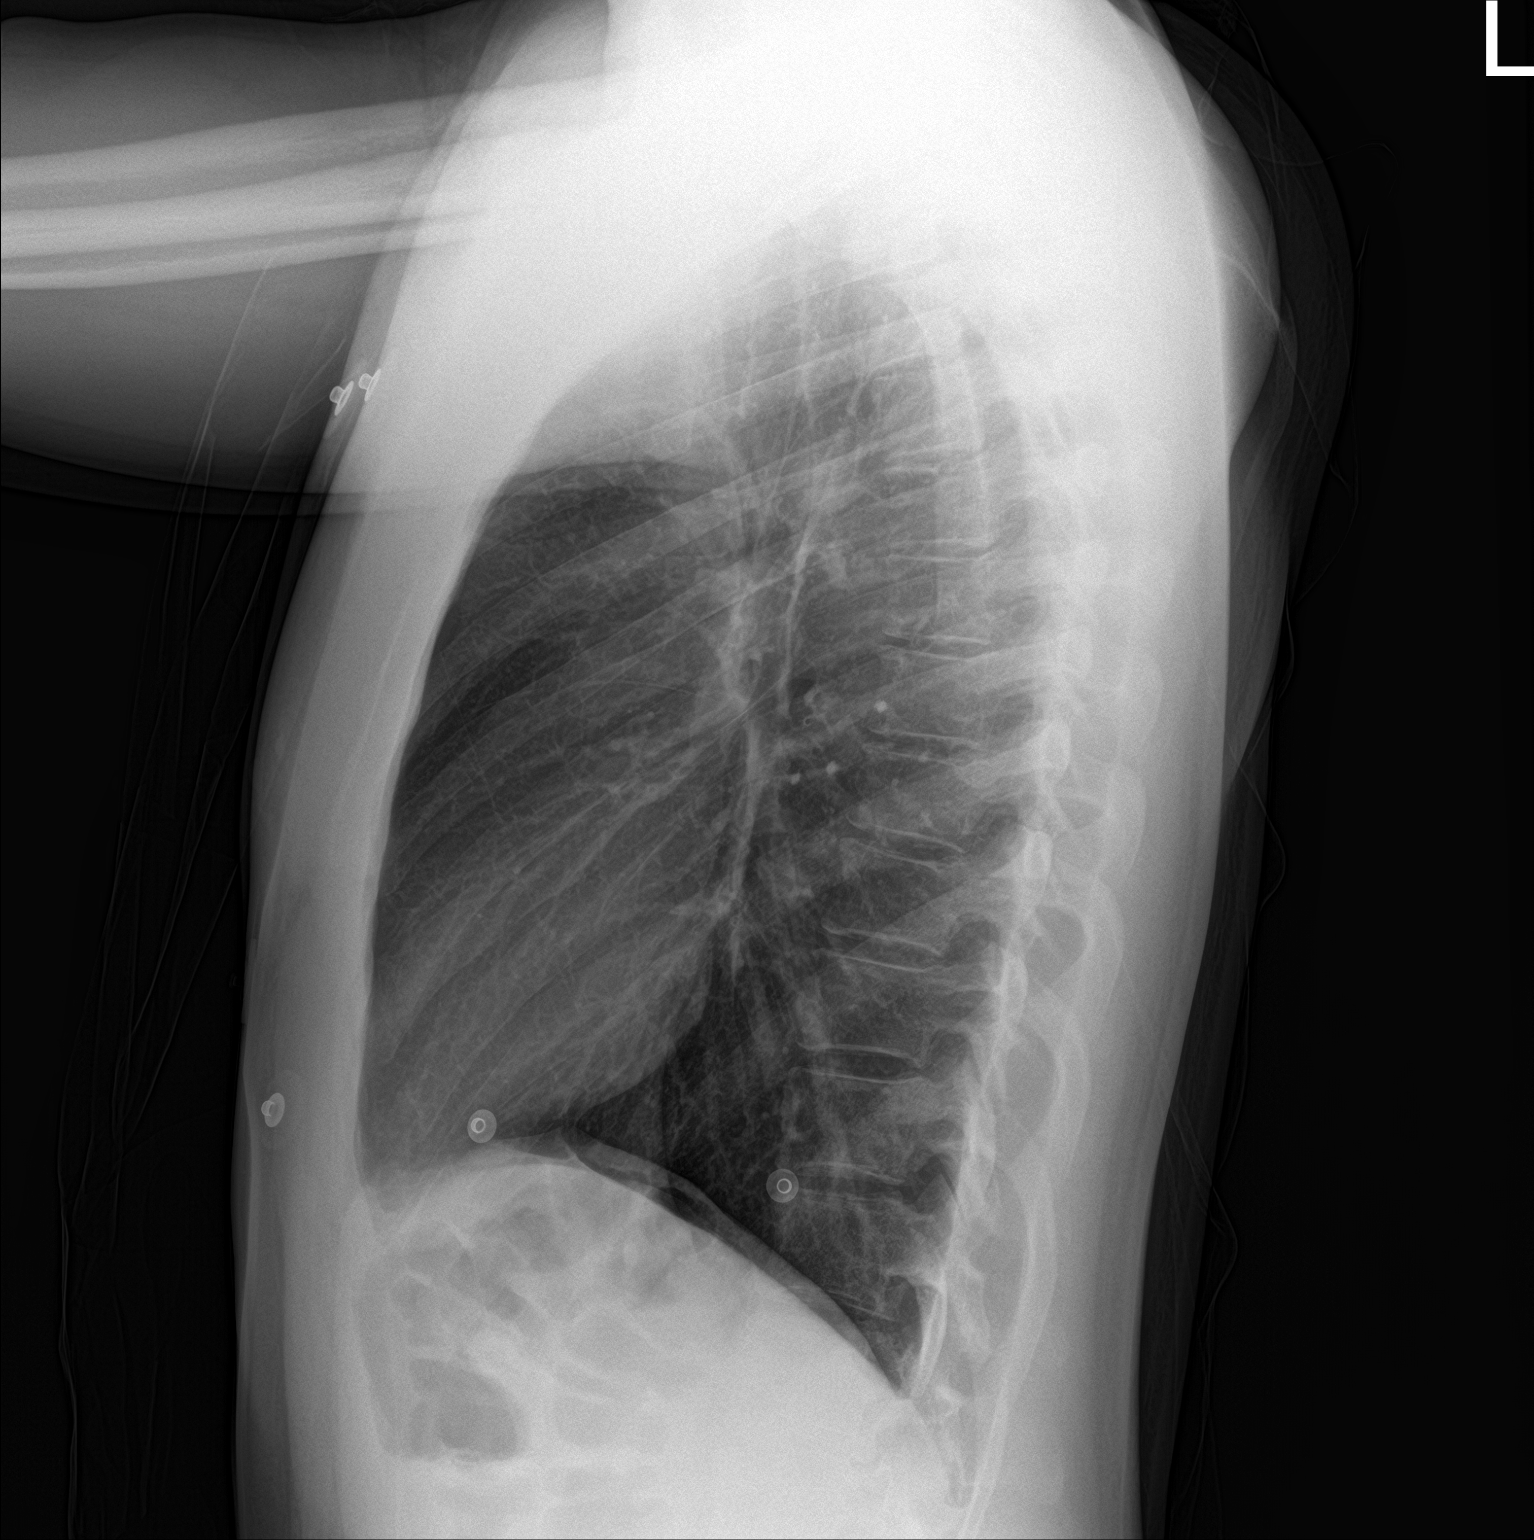

[2 of 2 positions shown; findings below may reference images not displayed]

FINDINGS: No active infiltrate or effusion is seen. Mediastinal and hilar
contours are unremarkable. The heart is within normal limits in
size. No bony abnormality is seen.
IMPRESSION: No active cardiopulmonary disease.

## 2020-10-23 ENCOUNTER — Encounter (HOSPITAL_COMMUNITY): Payer: Self-pay | Admitting: Emergency Medicine

## 2020-10-23 ENCOUNTER — Other Ambulatory Visit: Payer: Self-pay

## 2020-10-23 ENCOUNTER — Emergency Department (HOSPITAL_COMMUNITY)
Admission: EM | Admit: 2020-10-23 | Discharge: 2020-10-23 | Disposition: A | Discharge status: Home / Self Care | Payer: Medicaid Other | Attending: Emergency Medicine | Admitting: Emergency Medicine

## 2020-10-23 DIAGNOSIS — Z91010 Allergy to peanuts: Secondary | ICD-10-CM | POA: Diagnosis not present

## 2020-10-23 DIAGNOSIS — T7840XA Allergy, unspecified, initial encounter: Secondary | ICD-10-CM | POA: Diagnosis not present

## 2020-10-23 DIAGNOSIS — R21 Rash and other nonspecific skin eruption: Secondary | ICD-10-CM | POA: Diagnosis present

## 2020-10-23 DIAGNOSIS — Z87891 Personal history of nicotine dependence: Secondary | ICD-10-CM | POA: Diagnosis not present

## 2020-10-23 MED ORDER — PREDNISONE 20 MG PO TABS
60.0000 mg | ORAL_TABLET | Freq: Once | ORAL | Status: AC
Start: 2020-10-23 — End: 2020-10-23
  Administered 2020-10-23: 60 mg via ORAL
  Filled 2020-10-23: qty 3

## 2020-10-23 MED ORDER — DIPHENHYDRAMINE HCL 25 MG PO CAPS
50.0000 mg | ORAL_CAPSULE | Freq: Once | ORAL | Status: AC
  Administered 2020-10-23: 50 mg via ORAL
  Filled 2020-10-23: qty 2

## 2020-10-23 MED ORDER — EPINEPHRINE 0.3 MG/0.3ML IJ SOAJ: 0.3000 mg | INTRAMUSCULAR | 0 refills | Status: DC | PRN

## 2020-10-23 MED ORDER — PREDNISONE 20 MG PO TABS: 20.0000 mg | ORAL_TABLET | Freq: Two times a day (BID) | ORAL | 0 refills | Status: AC

## 2020-10-23 NOTE — ED Triage Notes (Addendum)
Reports swelling and itchiness to face since eating chicken at his brother's 2 days ago.  Denies SOB.  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

## 2020-10-23 NOTE — Discharge Instructions (Signed)
Call your primary care doctor or specialist as discussed in the next 2-3 days.   Return immediately back to the ER if:  Your symptoms worsen within the next 12-24 hours. You develop new symptoms such as new fevers, persistent vomiting, new pain, shortness of breath, or new weakness or numbness, or if you have any other concerns.  

## 2020-10-23 NOTE — ED Provider Notes (Signed)
[facility] EMERGENCY DEPARTMENT Provider Note   CSN: [id number] Arrival date & time: 10/23/20  1315     History Chief Complaint  Patient presents with  . Allergic Reaction    Allen Stafford is a 30 y.o. male.  Patient states that he is allergic to peanuts and states that 2 days ago he ate some chicken that may or may not have been cooked in peanut oil.  Since that time he has taken Benadryl without significant improvement.  He states that he has had some itchiness to his face and around his eyes he has noticed some puffiness.  Denies any eye pain denies any shortness of breath nausea vomiting or diarrhea.  No history of anaphylaxis.  Taking Benadryl at home with minimal improvement.        History reviewed. No pertinent past medical history.  There are no problems to display for this patient.   History reviewed. No pertinent surgical history.     Family History  Problem Relation Age of Onset  . Diabetes Mother   . CAD Mother     Social History   Tobacco Use  . Smoking status: Former Games developer  . Smokeless tobacco: Never Used  Substance Use Topics  . Alcohol use: No  . Drug use: Yes    Types: Marijuana    Home Medications Prior to Admission medications   Medication Sig Start Date End Date Taking? Authorizing Provider  EPINEPHrine 0.3 mg/0.3 mL IJ SOAJ injection Inject 0.3 mg into the muscle as needed for anaphylaxis. 10/23/20  Yes Cheryll Cockayne, MD  predniSONE (DELTASONE) 20 MG tablet Take 1 tablet (20 mg total) by mouth in the morning and at bedtime for 5 days. 10/23/20 10/28/20 Yes Cheryll Cockayne, MD  amoxicillin-clavulanate (AUGMENTIN) 875-125 MG tablet Take 1 tablet by mouth 2 (two) times daily. One po bid x 7 days 06/25/19   Dartha Lodge, PA-C  fluticasone [facility]) 50 MCG/ACT nasal spray Place 2 sprays into both nostrils daily. 12/19/16   Kirichenko, Tatyana, PA-C  ibuprofen (ADVIL) 600 MG tablet Take 1 tablet (600 mg total) by mouth every 6  (six) hours as needed. 06/25/19   Dartha Lodge, PA-C  methocarbamol (ROBAXIN) 500 MG tablet Take 1 tablet (500 mg total) by mouth 2 (two) times daily. 06/15/17   Petrucelli, Samantha R, PA-C  naproxen (NAPROSYN) 500 MG tablet Take 1 tablet (500 mg total) by mouth 2 (two) times daily. 06/15/17   Petrucelli, Pleas Koch, PA-C  pseudoephedrine (SUDAFED) 60 MG tablet Take 1 tablet (60 mg total) by mouth every 6 (six) hours as needed for congestion. 12/19/16   Kirichenko, Lemont Fillers, PA-C    Allergies    Chocolate  Review of Systems   Review of Systems  Constitutional: Negative for fever.  HENT: Negative for ear pain and sore throat.   Eyes: Negative for pain.  Respiratory: Negative for cough.   Cardiovascular: Negative for chest pain.  Gastrointestinal: Negative for abdominal pain.  Genitourinary: Negative for flank pain.  Musculoskeletal: Negative for back pain.  Skin: Positive for rash. Negative for color change.  Neurological: Negative for syncope.  All other systems reviewed and are negative.   Physical Exam Updated Vital Signs BP 114/80 (BP Location: Right Arm)   Pulse 66   Temp 98.7 F (37.1 C) (Oral)   Resp 17   SpO2 94%   Physical Exam Constitutional:      General: He is not in acute distress.    Appearance: He  is well-developed.  HENT:     Head: Normocephalic.     Nose: Nose normal.  Eyes:     Extraocular Movements: Extraocular movements intact.  Cardiovascular:     Rate and Rhythm: Normal rate.  Pulmonary:     Effort: Pulmonary effort is normal.  Skin:    Coloration: Skin is not jaundiced.     Comments: Currently the rash appears improved no facial swelling or airway impingement noted.  Neurological:     Mental Status: He is alert. Mental status is at baseline.     ED Results / Procedures / Treatments   Labs (all labs ordered are listed, but only abnormal results are displayed) Labs Reviewed - No data to display  EKG None  Radiology No results  found.  Procedures Procedures   Medications Ordered in ED Medications  diphenhydrAMINE (BENADRYL) capsule 50 mg (50 mg Oral Given 10/23/20 1440)  predniSONE (DELTASONE) tablet 60 mg (60 mg Oral Given 10/23/20 1439)    ED Course  I have reviewed the triage vital signs and the nursing notes.  Pertinent labs & imaging results that were available during my care of the patient were reviewed by me and considered in my medical decision making (see chart for details).    MDM Rules/Calculators/A&P                          Patient appears comfortable at this time he appears septic and some medicine prior to my evaluation and symptoms have nearly completely resolved.  He states the Benadryl alone has not been working well at home.  No airway distress noted no swelling of the face or neck rest anywhere else noted.  No visible rashes noted on exam.  Given a prescription of steroids to take at home as well as an EpiPen.  Advised follow-up with his doctor within the week, advised immediate return for worsening symptoms or any additional concerns.  Final Clinical Impression(s) / ED Diagnoses Final diagnoses:  Allergic reaction, initial encounter    Rx / DC Orders ED Discharge Orders         Ordered    predniSONE (DELTASONE) 20 MG tablet  2 times daily        10/23/20 1552    EPINEPHrine 0.3 mg/0.3 mL IJ SOAJ injection  As needed        10/23/20 1552           Cheryll Cockayne, MD 10/23/20 [phone]

## 2020-10-23 NOTE — ED Provider Notes (Signed)
MSE was initiated and I personally evaluated the patient and placed orders (if any) at  1:56 PM on October 23, 2020.  Patient placed in Quick Look pathway, seen and evaluated   Chief Complaint: swelling of face  HPI:   Patient states that he is allergic to peanuts and states that 2 days ago he ate some chicken that may or may not have been cooked in peanut oil.  Since that time he has taken Benadryl without significant improvement.  He states that he has had some itchiness to his face and around his eyes he has noticed some puffiness.  Denies any eye pain denies any shortness of breath nausea vomiting or diarrhea.  No history of anaphylaxis.  ROS: Facial itching and swelling (one)  Physical Exam:   Gen: No distress  Neuro: Awake and Alert  Skin: Warm    Focused Exam: Mild puffiness around both lower eyelids.  Extraocular movements are intact.  Lungs are clear to auscultation all fields.  No abdominal tenderness on exam.  Suspect mild allergic reaction.  Will provide patient with prednisone and Benadryl he will be placed in the waiting room he is not anaphylactic and he will be brought to immediate care when availability presented to self.  Initiation of care has begun. The patient has been counseled on the process, plan, and necessity for staying for the completion/evaluation, and the remainder of the medical screening examination   The patient appears stable so that the remainder of the MSE may be completed by another provider.   Solon Augusta [place], [place] 10/23/20 1357    Tilden Fossa, MD 10/23/20 1445

## 2021-12-02 ENCOUNTER — Emergency Department (HOSPITAL_COMMUNITY)
Admission: EM | Admit: 2021-12-02 | Discharge: 2021-12-02 | Disposition: A | Discharge status: Home / Self Care | Payer: Medicaid Other | Attending: Emergency Medicine | Admitting: Emergency Medicine

## 2021-12-02 ENCOUNTER — Other Ambulatory Visit: Payer: Self-pay

## 2021-12-02 ENCOUNTER — Encounter (HOSPITAL_COMMUNITY): Payer: Self-pay

## 2021-12-02 DIAGNOSIS — S46911A Strain of unspecified muscle, fascia and tendon at shoulder and upper arm level, right arm, initial encounter: Secondary | ICD-10-CM | POA: Diagnosis not present

## 2021-12-02 DIAGNOSIS — X58XXXA Exposure to other specified factors, initial encounter: Secondary | ICD-10-CM | POA: Diagnosis not present

## 2021-12-02 DIAGNOSIS — M25511 Pain in right shoulder: Secondary | ICD-10-CM | POA: Diagnosis present

## 2021-12-02 MED ORDER — METHOCARBAMOL 500 MG PO TABS: 500.0000 mg | ORAL_TABLET | Freq: Two times a day (BID) | ORAL | 0 refills | Status: DC

## 2021-12-02 NOTE — ED Triage Notes (Signed)
Patient complains of right shoulder pain that has been going on for a while but worse over past week. Complains of pain in trap muscle and difficulty "cracking" his neck.  Shoulder is not dislocated.

## 2021-12-02 NOTE — Discharge Instructions (Addendum)
You likely have a muscle strain or muscle spasm in that right shoulder.  I have sent muscle relaxer into the pharmacy for you.  You are given a sling at your request.  Ensure you take your arm out of the sling a few times a day to do range of motion exercises otherwise you can suffer from frozen shoulder.  If you have any worsening symptoms you can return to the emergency room otherwise have attached orthopedic information for you to follow-up with as needed.

## 2021-12-02 NOTE — ED Provider Notes (Signed)
[facility] EMERGENCY DEPARTMENT Provider Note   CSN: [id number] Arrival date & time: 12/02/21  1287     History  Chief Complaint  Patient presents with   Shoulder Pain    Allen Stafford is a 31 y.o. male.  31 year old male presents today for evaluation of right shoulder pain ongoing for about 1.5-week.  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  Denies any known injuries to the right shoulder.  He is without fever, decreased strength in the right arm.  Denies neck pain.  Describes it as muscle tightness/spasm that comes in waves.  Has not tried anything for this over-the-counter.  The history is provided by the patient. No language interpreter was used.      Home Medications Prior to Admission medications   Medication Sig Start Date End Date Taking? Authorizing Provider  methocarbamol (ROBAXIN) 500 MG tablet Take 1 tablet (500 mg total) by mouth 2 (two) times daily. 12/02/21  Yes Karie Mainland, Mina Carlisi, PA-C  amoxicillin-clavulanate (AUGMENTIN) 875-125 MG tablet Take 1 tablet by mouth 2 (two) times daily. One po bid x 7 days 06/25/19   Dartha Lodge, PA-C  EPINEPHrine 0.3 mg/0.3 mL IJ SOAJ injection Inject 0.3 mg into the muscle as needed for anaphylaxis. 10/23/20   Cheryll Cockayne, MD  fluticasone (FLONASE) 50 MCG/ACT nasal spray Place 2 sprays into both nostrils daily. 12/19/16   Kirichenko, Tatyana, PA-C  ibuprofen (ADVIL) 600 MG tablet Take 1 tablet (600 mg total) by mouth every 6 (six) hours as needed. 06/25/19   Dartha Lodge, PA-C  naproxen (NAPROSYN) 500 MG tablet Take 1 tablet (500 mg total) by mouth 2 (two) times daily. 06/15/17   Petrucelli, Pleas Koch, PA-C  pseudoephedrine (SUDAFED) 60 MG tablet Take 1 tablet (60 mg total) by mouth every 6 (six) hours as needed for congestion. 12/19/16   Kirichenko, Lemont Fillers, PA-C      Allergies    Chocolate    Review of Systems   Review of Systems  Constitutional:  Negative for chills and fever.  Musculoskeletal:  Positive for  myalgias. Negative for arthralgias, joint swelling, neck pain and neck stiffness.  Neurological:  Negative for weakness and numbness.  All other systems reviewed and are negative.  Physical Exam Updated Vital Signs BP (!) 136/58 (BP Location: Left Arm)   Pulse 64   Temp 98.3 F (36.8 C) (Oral)   Resp 14   Ht 5\' 9"  (1.753 m)   Wt 77.6 kg   SpO2 100%   BMI 25.25 kg/m  Physical Exam Vitals and nursing note reviewed.  Constitutional:      General: He is not in acute distress.    Appearance: Normal appearance. He is not ill-appearing.  HENT:     Head: Normocephalic and atraumatic.     Nose: Nose normal.  Eyes:     General: No scleral icterus.    Extraocular Movements: Extraocular movements intact.     Conjunctiva/sclera: Conjunctivae normal.  Cardiovascular:     Rate and Rhythm: Normal rate and regular rhythm.  Pulmonary:     Effort: Pulmonary effort is normal. No respiratory distress.  Musculoskeletal:        General: Normal range of motion.     Cervical back: Normal range of motion.     Comments: Good range of motion in the right shoulder joint.  Right shoulder joint without tenderness to palpation.  Right elbow with full range of motion.  5/5 strength in the right upper extremity.  Tightness  of the right trapezius muscle noted.  Cervical spine without tenderness to palpation.  Skin:    General: Skin is warm and dry.  Neurological:     General: No focal deficit present.     Mental Status: He is alert. Mental status is at baseline.    ED Results / Procedures / Treatments   Labs (all labs ordered are listed, but only abnormal results are displayed) Labs Reviewed - No data to display  EKG None  Radiology No results found.  Procedures Procedures    Medications Ordered in ED Medications - No data to display  ED Course/ Medical Decision Making/ A&P                           Medical Decision Making Risk Prescription drug management.   31 year old male  presents today for evaluation of right shoulder pain.  Exam shows tenderness of the trapezius muscle on the right.  Good shoulder range of motion and strength.  Denies any injury to the right shoulder.  Cervical spine without tenderness to palpation.  Patient most likely has muscle strain/spasm.  Will provide Robaxin.  Return precautions discussed.  Patient voices understanding and is in agreement with plan.  He does not have PCP.  Provided him with Ortho follow-up if he does not have improvement in symptoms with Robaxin.   Final Clinical Impression(s) / ED Diagnoses Final diagnoses:  Strain of right shoulder, initial encounter    Rx / DC Orders ED Discharge Orders          Ordered    methocarbamol (ROBAXIN) 500 MG tablet  2 times daily        12/02/21 0734              Marita Kansas, PA-C 12/02/21 8250    Arby Barrette, MD 12/02/21 1146

## 2021-12-12 ENCOUNTER — Encounter (HOSPITAL_COMMUNITY): Payer: Self-pay | Admitting: Emergency Medicine

## 2021-12-12 ENCOUNTER — Emergency Department (HOSPITAL_COMMUNITY)
Admission: EM | Admit: 2021-12-12 | Discharge: 2021-12-12 | Discharge status: Against Medical Advice | Payer: Medicaid Other | Attending: Emergency Medicine | Admitting: Emergency Medicine

## 2021-12-12 ENCOUNTER — Other Ambulatory Visit: Payer: Self-pay

## 2021-12-12 DIAGNOSIS — Z5321 Procedure and treatment not carried out due to patient leaving prior to being seen by health care provider: Secondary | ICD-10-CM | POA: Insufficient documentation

## 2021-12-12 DIAGNOSIS — R519 Headache, unspecified: Secondary | ICD-10-CM | POA: Insufficient documentation

## 2021-12-12 DIAGNOSIS — J3489 Other specified disorders of nose and nasal sinuses: Secondary | ICD-10-CM | POA: Insufficient documentation

## 2021-12-12 DIAGNOSIS — Z20822 Contact with and (suspected) exposure to covid-19: Secondary | ICD-10-CM | POA: Diagnosis not present

## 2021-12-12 DIAGNOSIS — R059 Cough, unspecified: Secondary | ICD-10-CM | POA: Diagnosis not present

## 2021-12-12 LAB — SARS CORONAVIRUS 2 BY RT PCR: SARS Coronavirus 2 by RT PCR: NEGATIVE

## 2021-12-12 NOTE — ED Notes (Signed)
Pt stated "if not seen within 30 minutes pt was leaving". Pt requested to be "signed out".

## 2021-12-12 NOTE — ED Triage Notes (Signed)
C/o runny nose, headache, and productive cough with yellow phlegm since Friday.  Denies fever and chills.

## 2022-06-21 ENCOUNTER — Other Ambulatory Visit: Payer: Self-pay

## 2022-06-21 ENCOUNTER — Encounter (HOSPITAL_COMMUNITY): Payer: Self-pay | Admitting: Emergency Medicine

## 2022-06-21 ENCOUNTER — Emergency Department (HOSPITAL_COMMUNITY)
Admission: EM | Admit: 2022-06-21 | Discharge: 2022-06-21 | Disposition: A | Discharge status: Home / Self Care | Payer: Medicaid Other | Attending: Emergency Medicine | Admitting: Emergency Medicine

## 2022-06-21 DIAGNOSIS — R051 Acute cough: Secondary | ICD-10-CM

## 2022-06-21 DIAGNOSIS — U071 COVID-19: Secondary | ICD-10-CM | POA: Diagnosis not present

## 2022-06-21 DIAGNOSIS — M791 Myalgia, unspecified site: Secondary | ICD-10-CM | POA: Diagnosis present

## 2022-06-21 LAB — RESP PANEL BY RT-PCR (RSV, FLU A&B, COVID)  RVPGX2
Influenza A by PCR: NEGATIVE
Influenza B by PCR: NEGATIVE
Resp Syncytial Virus by PCR: NEGATIVE
SARS Coronavirus 2 by RT PCR: POSITIVE — AB

## 2022-06-21 LAB — GROUP A STREP BY PCR: Group A Strep by PCR: NOT DETECTED

## 2022-06-21 MED ORDER — BENZONATATE 100 MG PO CAPS: 100.0000 mg | ORAL_CAPSULE | Freq: Three times a day (TID) | ORAL | 0 refills | Status: DC

## 2022-06-21 MED ORDER — ONDANSETRON 4 MG PO TBDP: 4.0000 mg | ORAL_TABLET | Freq: Three times a day (TID) | ORAL | 0 refills | Status: DC | PRN

## 2022-06-21 MED ORDER — ACETAMINOPHEN ER 650 MG PO TBCR: 650.0000 mg | EXTENDED_RELEASE_TABLET | Freq: Three times a day (TID) | ORAL | 0 refills | Status: DC | PRN

## 2022-06-21 MED ORDER — BENZONATATE 100 MG PO CAPS
100.0000 mg | ORAL_CAPSULE | Freq: Once | ORAL | Status: AC
  Administered 2022-06-21: 100 mg via ORAL
  Filled 2022-06-21: qty 1

## 2022-06-21 MED ORDER — ACETAMINOPHEN 500 MG PO TABS
1000.0000 mg | ORAL_TABLET | Freq: Once | ORAL | Status: AC
  Administered 2022-06-21: 1000 mg via ORAL
  Filled 2022-06-21: qty 2

## 2022-06-21 NOTE — Discharge Instructions (Signed)
It was a pleasure taking care of you today.  As discussed, your COVID test was positive.  I am sending you home with symptomatic treatment.  Take as needed.  You must quarantine for 5 days since symptom onset.  Follow-up with PCP if symptoms do not improve over the next few days.  Return to the ER for any worsening symptoms.

## 2022-06-21 NOTE — ED Triage Notes (Signed)
Pt reports body aches, lower abd pain, leg pain and back pain, and headaches for 2 days.  Denies n/v/d.

## 2022-06-21 NOTE — ED Provider Notes (Signed)
[facility] EMERGENCY DEPARTMENT Provider Note   CSN: [id number] Arrival date & time: 06/21/22  2119     History  Chief Complaint  Patient presents with   Generalized Body Aches    Allen Stafford is a 31 y.o. male with no significant past medical history who presents to the ED due to myalgias, sore throat, and cough x 2 days.  Denies nausea, vomiting, diarrhea.  Patient states he is unsure whether or not his kids are sick with similar symptoms.  Denies chest pain and shortness of breath.  No medical conditions.  Not currently any medications.  History obtained from patient and past medical records. No interpreter used during encounter.       Home Medications Prior to Admission medications   Medication Sig Start Date End Date Taking? Authorizing Provider  acetaminophen (TYLENOL 8 HOUR) 650 MG CR tablet Take 1 tablet (650 mg total) by mouth every 8 (eight) hours as needed for pain. 06/21/22  Yes Claryce Friel, Merla Riches, PA-C  benzonatate (TESSALON) 100 MG capsule Take 1 capsule (100 mg total) by mouth every 8 (eight) hours. 06/21/22  Yes Elman Dettman C, PA-C  ondansetron (ZOFRAN-ODT) 4 MG disintegrating tablet Take 1 tablet (4 mg total) by mouth every 8 (eight) hours as needed for nausea or vomiting. 06/21/22  Yes Abdul Beirne C, PA-C  amoxicillin-clavulanate (AUGMENTIN) 875-125 MG tablet Take 1 tablet by mouth 2 (two) times daily. One po bid x 7 days 06/25/19   Dartha Lodge, PA-C  EPINEPHrine 0.3 mg/0.3 mL IJ SOAJ injection Inject 0.3 mg into the muscle as needed for anaphylaxis. 10/23/20   Cheryll Cockayne, MD  fluticasone (FLONASE) 50 MCG/ACT nasal spray Place 2 sprays into both nostrils daily. 12/19/16   Kirichenko, Tatyana, PA-C  ibuprofen (ADVIL) 600 MG tablet Take 1 tablet (600 mg total) by mouth every 6 (six) hours as needed. 06/25/19   Dartha Lodge, PA-C  methocarbamol (ROBAXIN) 500 MG tablet Take 1 tablet (500 mg total) by mouth 2 (two) times daily.  12/02/21   Karie Mainland, Amjad, PA-C  naproxen (NAPROSYN) 500 MG tablet Take 1 tablet (500 mg total) by mouth 2 (two) times daily. 06/15/17   Petrucelli, Pleas Koch, PA-C  pseudoephedrine (SUDAFED) 60 MG tablet Take 1 tablet (60 mg total) by mouth every 6 (six) hours as needed for congestion. 12/19/16   Kirichenko, Lemont Fillers, PA-C      Allergies    Chocolate    Review of Systems   Review of Systems  Constitutional:  Negative for chills and fever.  HENT:  Positive for sore throat. Negative for trouble swallowing and voice change.   Respiratory:  Positive for cough. Negative for shortness of breath.   Cardiovascular:  Negative for chest pain.  Gastrointestinal:  Negative for abdominal pain, diarrhea, nausea and vomiting.  Musculoskeletal:  Positive for myalgias.  All other systems reviewed and are negative.   Physical Exam Updated Vital Signs BP 112/84   Pulse (!) 56   Temp 99.6 F (37.6 C) (Oral)   Resp 17   Ht 5\' 9"  (1.753 m)   Wt 77 kg   SpO2 96%   BMI 25.07 kg/m  Physical Exam Vitals and nursing note reviewed.  Constitutional:      General: He is not in acute distress.    Appearance: He is not ill-appearing.  HENT:     Head: Normocephalic.  Eyes:     Pupils: Pupils are equal, round, and reactive to light.  Cardiovascular:  Rate and Rhythm: Normal rate and regular rhythm.     Pulses: Normal pulses.     Heart sounds: Normal heart sounds. No murmur heard.    No friction rub. No gallop.  Pulmonary:     Effort: Pulmonary effort is normal.     Breath sounds: Normal breath sounds.  Abdominal:     General: Abdomen is flat. There is no distension.     Palpations: Abdomen is soft.     Tenderness: There is no abdominal tenderness. There is no guarding or rebound.  Musculoskeletal:        General: Normal range of motion.     Cervical back: Neck supple.  Skin:    General: Skin is warm and dry.  Neurological:     General: No focal deficit present.     Mental Status: He is alert.   Psychiatric:        Mood and Affect: Mood normal.        Behavior: Behavior normal.     ED Results / Procedures / Treatments   Labs (all labs ordered are listed, but only abnormal results are displayed) Labs Reviewed  RESP PANEL BY RT-PCR (RSV, FLU A&B, COVID)  RVPGX2 - Abnormal; Notable for the following components:      Result Value   SARS Coronavirus 2 by RT PCR POSITIVE (*)    All other components within normal limits  GROUP A STREP BY PCR    EKG None  Radiology No results found.  Procedures Procedures    Medications Ordered in ED Medications  acetaminophen (TYLENOL) tablet 1,000 mg (1,000 mg Oral Given 06/21/22 0909)  benzonatate (TESSALON) capsule 100 mg (100 mg Oral Given 06/21/22 0488)    ED Course/ Medical Decision Making/ A&P Clinical Course as of 06/21/22 1109  Wed Jun 21, 2022  1058 Group A Strep by PCR: NOT DETECTED [CA]  1101 SARS Coronavirus 2 by RT PCR(!): POSITIVE [CA]    Clinical Course User Index [CA] Mannie Stabile, PA-C                           Medical Decision Making Amount and/or Complexity of Data Reviewed Labs:  Decision-making details documented in ED Course.  Risk OTC drugs. Prescription drug management.   31 year old male presents to the ED due to myalgias, cough, sore throat x 2 days.  Denies nausea, vomiting, and diarrhea.  No fever.  Upon arrival, stable vitals.  Patient  In no acute distress.  Benign physical exam.  Throat with mild erythema.  No tonsillar hypertrophy or exudates.  No meningismus to suggest meningitis.  Lungs clear to auscultation bilaterally.  Low suspicion for pneumonia. No tender abdomen. Suspect viral etiology.  Respiratory panel ordered.  Strep test ordered. Tylenol and Tessalon given for symptomatic relief.  COVID-positive.  Suspect symptoms related to COVID-19 infection.  Patient admits to improvement after Tylenol here in the ED.  Will discharge with symptomatic treatment.  Discussed quarantine  guidelines with patient.  No evidence of respiratory distress.  Low suspicion for bacterial infection. Strict ED precautions discussed with patient. Patient states understanding and agrees to plan. Patient discharged home in no acute distress and stable vitals   No PCP       Final Clinical Impression(s) / ED Diagnoses Final diagnoses:  Acute cough  COVID-19 virus infection    Rx / DC Orders ED Discharge Orders          Ordered  benzonatate (TESSALON) 100 MG capsule  Every 8 hours        06/21/22 0949    acetaminophen (TYLENOL 8 HOUR) 650 MG CR tablet  Every 8 hours PRN        06/21/22 1107    ondansetron (ZOFRAN-ODT) 4 MG disintegrating tablet  Every 8 hours PRN        06/21/22 1107              Jesusita Oka 06/21/22 1109    Shon Baton, MD 06/21/22 1248

## 2022-07-27 ENCOUNTER — Emergency Department (HOSPITAL_COMMUNITY)
Admission: EM | Admit: 2022-07-27 | Discharge: 2022-07-27 | Disposition: A | Discharge status: Home / Self Care | Payer: Medicaid Other | Attending: Emergency Medicine | Admitting: Emergency Medicine

## 2022-07-27 ENCOUNTER — Emergency Department (HOSPITAL_COMMUNITY): Payer: Medicaid Other

## 2022-07-27 ENCOUNTER — Other Ambulatory Visit: Payer: Self-pay

## 2022-07-27 ENCOUNTER — Encounter (HOSPITAL_COMMUNITY): Payer: Self-pay

## 2022-07-27 DIAGNOSIS — M79605 Pain in left leg: Secondary | ICD-10-CM | POA: Diagnosis present

## 2022-07-27 DIAGNOSIS — R200 Anesthesia of skin: Secondary | ICD-10-CM | POA: Diagnosis not present

## 2022-07-27 DIAGNOSIS — Y92410 Unspecified street and highway as the place of occurrence of the external cause: Secondary | ICD-10-CM | POA: Diagnosis not present

## 2022-07-27 DIAGNOSIS — S8012XA Contusion of left lower leg, initial encounter: Secondary | ICD-10-CM | POA: Insufficient documentation

## 2022-07-27 LAB — PROTIME-INR
INR: 1 (ref 0.8–1.2)
Prothrombin Time: 12.7 seconds (ref 11.4–15.2)

## 2022-07-27 LAB — CBC
HCT: 49.1 % (ref 39.0–52.0)
Hemoglobin: 16.4 g/dL (ref 13.0–17.0)
MCH: 28.8 pg (ref 26.0–34.0)
MCHC: 33.4 g/dL (ref 30.0–36.0)
MCV: 86.3 fL (ref 80.0–100.0)
Platelets: 288 10*3/uL (ref 150–400)
RBC: 5.69 MIL/uL (ref 4.22–5.81)
RDW: 13.1 % (ref 11.5–15.5)
WBC: 4.9 10*3/uL (ref 4.0–10.5)
nRBC: 0 % (ref 0.0–0.2)

## 2022-07-27 LAB — I-STAT CHEM 8, ED
BUN: 13 mg/dL (ref 6–20)
Calcium, Ion: 1.15 mmol/L (ref 1.15–1.40)
Chloride: 104 mmol/L (ref 98–111)
Creatinine, Ser: 1.2 mg/dL (ref 0.61–1.24)
Glucose, Bld: 88 mg/dL (ref 70–99)
HCT: 52 % (ref 39.0–52.0)
Hemoglobin: 17.7 g/dL — ABNORMAL HIGH (ref 13.0–17.0)
Potassium: 3.8 mmol/L (ref 3.5–5.1)
Sodium: 142 mmol/L (ref 135–145)
TCO2: 28 mmol/L (ref 22–32)

## 2022-07-27 LAB — SAMPLE TO BLOOD BANK

## 2022-07-27 LAB — COMPREHENSIVE METABOLIC PANEL
ALT: 31 U/L (ref 0–44)
AST: 34 U/L (ref 15–41)
Albumin: 5.2 g/dL — ABNORMAL HIGH (ref 3.5–5.0)
Alkaline Phosphatase: 54 U/L (ref 38–126)
Anion gap: 13 (ref 5–15)
BUN: 10 mg/dL (ref 6–20)
CO2: 25 mmol/L (ref 22–32)
Calcium: 9.9 mg/dL (ref 8.9–10.3)
Chloride: 102 mmol/L (ref 98–111)
Creatinine, Ser: 1.19 mg/dL (ref 0.61–1.24)
GFR, Estimated: >60 mL/min (ref >60)
Glucose, Bld: 95 mg/dL (ref 70–99)
Potassium: 3.8 mmol/L (ref 3.5–5.1)
Sodium: 140 mmol/L (ref 135–145)
Total Bilirubin: 0.9 mg/dL (ref 0.3–1.2)
Total Protein: 8.4 g/dL — ABNORMAL HIGH (ref 6.5–8.1)

## 2022-07-27 LAB — ETHANOL: Alcohol, Ethyl (B): <10 mg/dL (ref <10)

## 2022-07-27 LAB — LACTIC ACID, PLASMA: Lactic Acid, Venous: 1.6 mmol/L (ref 0.5–1.9)

## 2022-07-27 MED ORDER — IOHEXOL 350 MG/ML SOLN
75.0000 mL | Freq: Once | INTRAVENOUS | Status: AC | PRN
  Administered 2022-07-27: 75 mL via INTRAVENOUS
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
## 2022-07-27 NOTE — ED Notes (Signed)
X-ray at bedside
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
## 2024-07-29 ENCOUNTER — Ambulatory Visit
